# Patient Record
Sex: Male | Born: 1985 | Race: White | Hispanic: No | Marital: Single | Smoking: Never smoker
Health system: Southern US, Community
[De-identification: ages and names within clinical notes are randomized; demographics above are authoritative.]

## PROBLEM LIST (undated history)

## (undated) DIAGNOSIS — K589 Irritable bowel syndrome without diarrhea: Secondary | ICD-10-CM

## (undated) DIAGNOSIS — R011 Cardiac murmur, unspecified: Secondary | ICD-10-CM

## (undated) HISTORY — DX: Cardiac murmur, unspecified: R01.1

## (undated) HISTORY — DX: Irritable bowel syndrome without diarrhea: K58.9

---
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
## 2013-11-09 ENCOUNTER — Encounter: Payer: Self-pay | Admitting: Internal Medicine

## 2013-11-12 ENCOUNTER — Encounter: Payer: Self-pay | Admitting: Emergency Medicine

## 2014-01-01 ENCOUNTER — Other Ambulatory Visit: Payer: Self-pay | Admitting: Emergency Medicine

## 2014-01-01 MED ORDER — GLYCOPYRROLATE 2 MG PO TABS: 2.0000 mg | ORAL_TABLET | Freq: Two times a day (BID) | ORAL | Status: DC

## 2014-02-05 ENCOUNTER — Ambulatory Visit (INDEPENDENT_AMBULATORY_CARE_PROVIDER_SITE_OTHER): Payer: 59 | Admitting: Emergency Medicine

## 2014-02-05 ENCOUNTER — Encounter: Payer: Self-pay | Admitting: Emergency Medicine

## 2014-02-05 ENCOUNTER — Ambulatory Visit: Payer: Self-pay | Admitting: Physician Assistant

## 2014-02-05 VITALS — BP 104/76 | HR 62 | Temp 98.4°F | Resp 20 | Ht 68.0 in | Wt 150.0 lb

## 2014-02-05 DIAGNOSIS — M79641 Pain in right hand: Secondary | ICD-10-CM

## 2014-02-05 DIAGNOSIS — R58 Hemorrhage, not elsewhere classified: Secondary | ICD-10-CM

## 2014-02-05 DIAGNOSIS — M79609 Pain in unspecified limb: Secondary | ICD-10-CM

## 2014-02-05 DIAGNOSIS — M25531 Pain in right wrist: Secondary | ICD-10-CM

## 2014-02-05 DIAGNOSIS — M25539 Pain in unspecified wrist: Secondary | ICD-10-CM

## 2014-02-05 DIAGNOSIS — I998 Other disorder of circulatory system: Secondary | ICD-10-CM

## 2014-02-05 MED ORDER — MELOXICAM 15 MG PO TABS: 15.0000 mg | ORAL_TABLET | Freq: Every day | ORAL | Status: DC

## 2014-02-05 NOTE — Progress Notes (Signed)
   Subjective:    Patient ID: Alexander IbaJoshua M Boy, male    DOB: 01/10/1986, 28 y.o.   MRN: [id number]  HPI Comments: 28 yo male with right wrist pain he does not recall a sudden onset of pain after fall on WED or training on Thursday. He notes mild ache with certain positions. He denies any pain with push up. He has tried ice with out change. He tried to sleep in brace but made symptoms worse. He notes icy/ hot helps a little.   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 He notes bruise occurred after being tackled he denies any lumps or pain in area currently and notes it has improved with tenderness and color.  Wrist Pain    Current Outpatient Prescriptions on File Prior to Visit  Medication Sig Dispense Refill  . Cholecalciferol (VITAMIN D-3) 1000 UNITS CAPS Take by mouth.      Marland Kitchen. glycopyrrolate (ROBINUL) 2 MG tablet Take 1 tablet (2 mg total) by mouth 2 (two) times daily.  60 tablet  1   No current facility-administered medications on file prior to visit.    No Known Allergies Past Medical History  Diagnosis Date  . IBS (irritable bowel syndrome)   . Heart murmur     w/ tachy as child      Review of Systems  Musculoskeletal: Positive for arthralgias.  Hematological: Bruises/bleeds easily.  All other systems reviewed and are negative.   BP 104/76  Pulse 62  Temp(Src) 98.4 F (36.9 C) (Temporal)  Resp 20  Ht 5\' 8"  (1.727 m)  Wt 150 lb (68.04 kg)  BMI 22.81 kg/m2     Objective:   Physical Exam  Nursing note and vitals reviewed. Constitutional: He is oriented to person, place, and time. He appears well-developed and well-nourished.  HENT:  Head: Normocephalic and atraumatic.  Right Ear: External ear normal.  Left Ear: External ear normal.  Nose: Nose normal.  Mouth/Throat: Oropharynx is clear and moist.  Eyes: Conjunctivae are normal.  Neck: Normal range of motion.  Musculoskeletal: Normal range of motion. He exhibits no edema  and no tenderness.  No obvious abnormalities with Full ROM  Neurological: He is alert and oriented to person, place, and time. He has normal reflexes. No cranial nerve deficit. Coordination normal.  Skin: Skin is warm and dry.     Psychiatric: He has a normal mood and affect. Judgment normal.          Assessment & Plan:  1. Wrist/ hand discomfort- XRAY if no relief  With Meloxicam 15 mg AD, continue Icey/ HOT, d/c brace, continue Ice. 2. Bruising- Declines labs, advised if continues needs further workup

## 2014-02-05 NOTE — Patient Instructions (Signed)
Hematoma A hematoma is a collection of blood. The collection of blood can turn into a hard, painful lump under the skin. Your skin may turn blue or yellow if the hematoma is close to the surface of the skin. Most hematomas get better in a few days to weeks. Some hematomas are serious and need medical care. Hematomas can be very small or very big. HOME CARE  Apply ice to the injured area:  Put ice in a plastic bag.  Place a towel between your skin and the bag.  Leave the ice on for 20 minutes, 2 3 times a day for the first 1 to 2 days.  After the first 2 days, switch to using warm packs on the injured area.  Raise (elevate) the injured area to lessen pain and puffiness (swelling). You may also wrap the area with an elastic bandage. Make sure the bandage is not wrapped too tight.  If you have a painful hematoma on your leg or foot, you may use crutches for a couple days.  Only take medicines as told by your doctor. GET HELP RIGHT AWAY IF:   Your pain gets worse.  Your pain is not controlled with medicine.  You have a fever.  Your puffiness gets worse.  Your skin turns more blue or yellow.  Your skin over the hematoma breaks or starts bleeding.  Your hematoma is in your chest or belly (abdomen) and you are short of breath, feel weak, or have a change in consciousness.  Your hematoma is on your scalp and you have a headache that gets worse or a change in alertness or consciousness. MAKE SURE YOU:   Understand these instructions.  Will watch your condition.  Will get help right away if you are not doing well or get worse. Document Released: 12/23/2004 Document Revised: 07/18/2013 Document Reviewed: 04/25/2013 [facility]ExitCare Patient Information 2014 [place], [place]. Wrist Pain A wrist sprain happens when the bands of tissue that hold the wrist joints together (ligament) stretch too much or tear. A wrist strain happens when muscles or bands of tissue that connect muscles to bones  (tendons) are stretched or pulled. HOME CARE  Put ice on the injured area.  Put ice in a plastic bag.  Place a towel between your skin and the bag.  Leave the ice on for 15-20 minutes, 03-04 times a day, for the first 2 days.  Raise (elevate) the injured wrist to lessen puffiness (swelling).  Rest the injured wrist for at least 48 hours or as told by your doctor.  Wear a splint, cast, or an elastic wrap as told by your doctor.  Only take medicine as told by your doctor.  Follow up with your doctor as told. This is important. GET HELP RIGHT AWAY IF:   The fingers are puffy, very red, white, or cold and blue.  The fingers lose feeling (numb) or tingle.  The pain gets worse.  It is hard to move the fingers. MAKE SURE YOU:   Understand these instructions.  Will watch your condition.  Will get help right away if you are not doing well or get worse. Document Released: 05/03/2008 Document Revised: 02/07/2012 Document Reviewed: 01/06/2011 [facility]Exit[facility] Patient Information 2014 [place], [place].

## 2014-02-06 ENCOUNTER — Ambulatory Visit
Admission: RE | Admit: 2014-02-06 | Discharge: 2014-02-06 | Disposition: A | Discharge status: Home / Self Care | Payer: 59 | Source: Ambulatory Visit | Attending: Emergency Medicine | Admitting: Emergency Medicine

## 2014-02-06 DIAGNOSIS — M25531 Pain in right wrist: Secondary | ICD-10-CM

## 2014-02-06 DIAGNOSIS — M79641 Pain in right hand: Secondary | ICD-10-CM

## 2014-05-23 ENCOUNTER — Other Ambulatory Visit: Payer: Self-pay | Admitting: *Deleted

## 2014-05-23 MED ORDER — GLYCOPYRROLATE 2 MG PO TABS: 2.0000 mg | ORAL_TABLET | Freq: Two times a day (BID) | ORAL | Status: DC

## 2014-06-03 ENCOUNTER — Other Ambulatory Visit: Payer: Self-pay | Admitting: Internal Medicine

## 2014-06-03 MED ORDER — GLYCOPYRROLATE 2 MG PO TABS: 2.0000 mg | ORAL_TABLET | Freq: Two times a day (BID) | ORAL | Status: DC

## 2014-07-23 IMAGING — CR DG WRIST COMPLETE 3+V*R*
4 series · 4 of 4 positions shown · non-contrast
Comparison: none

[view not recorded (1 of 4)]
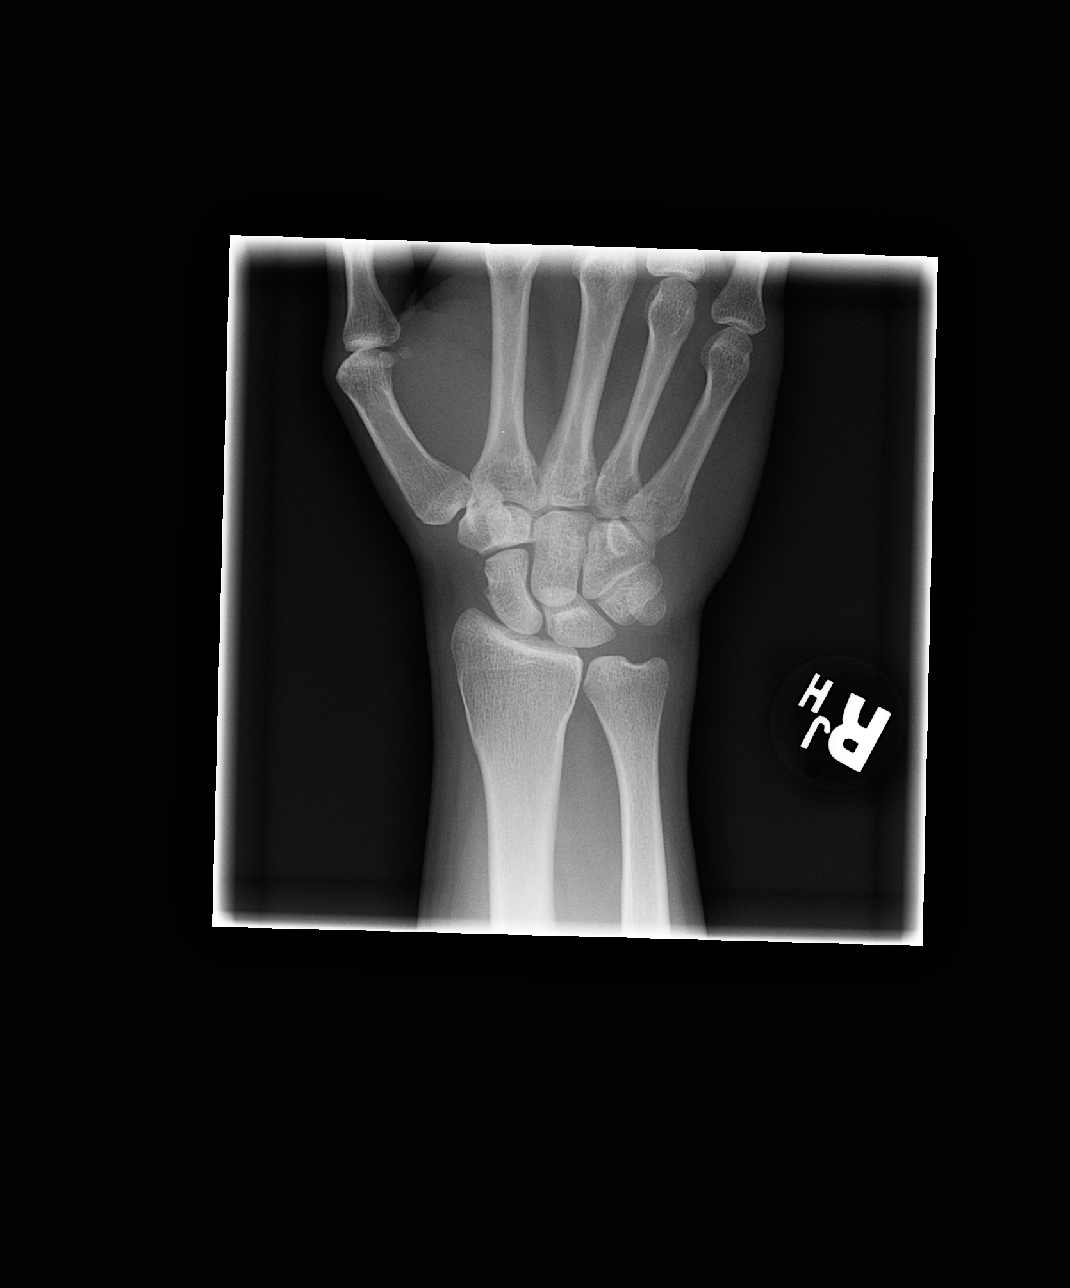

[view not recorded (2 of 4)]
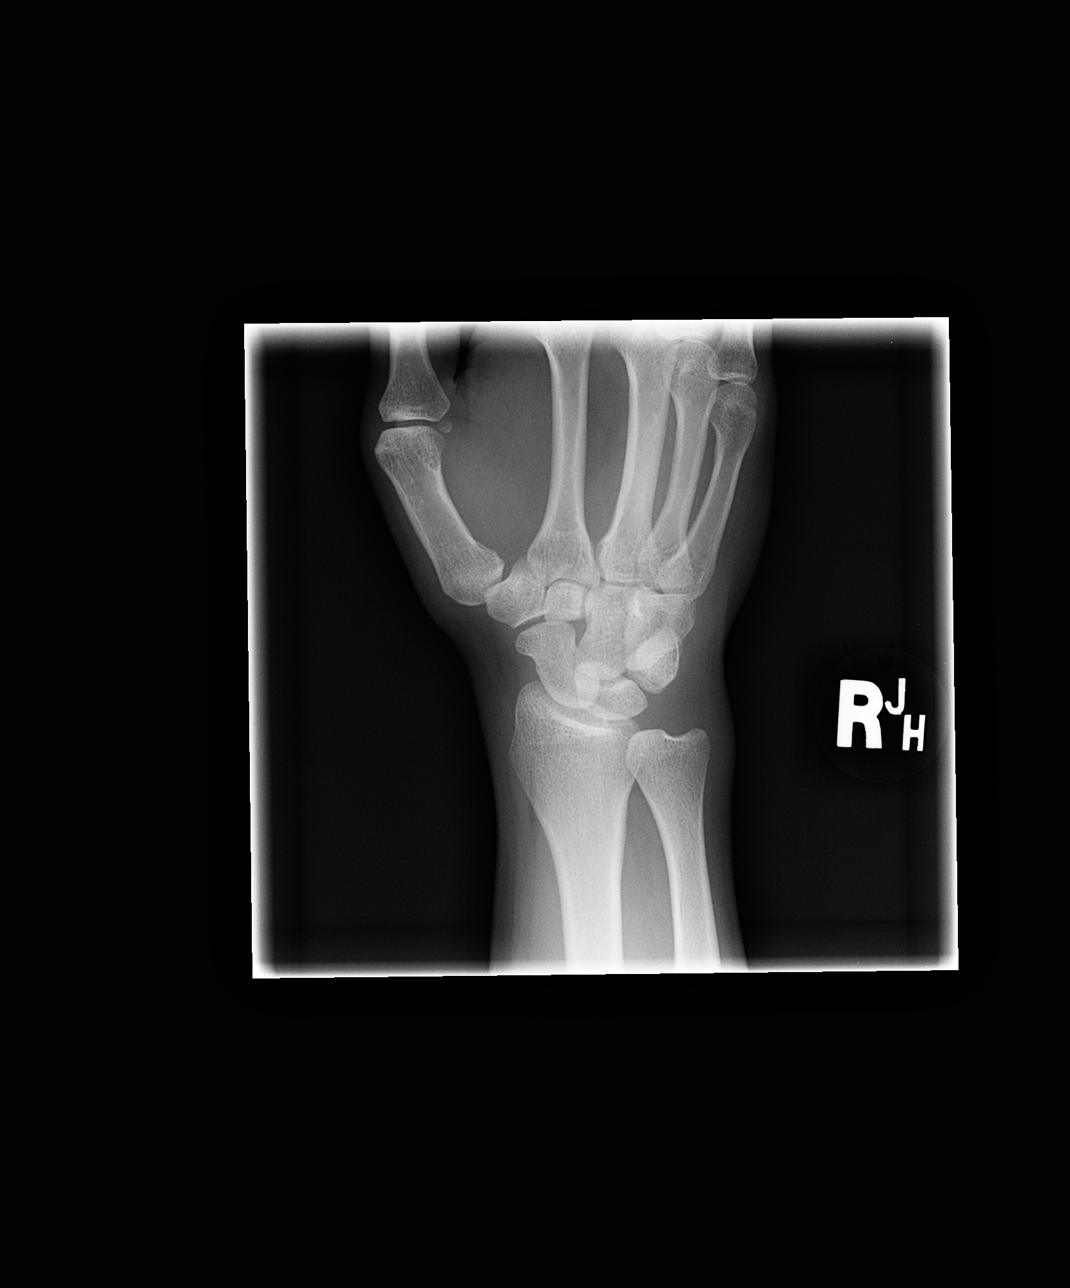

[view not recorded (3 of 4)]
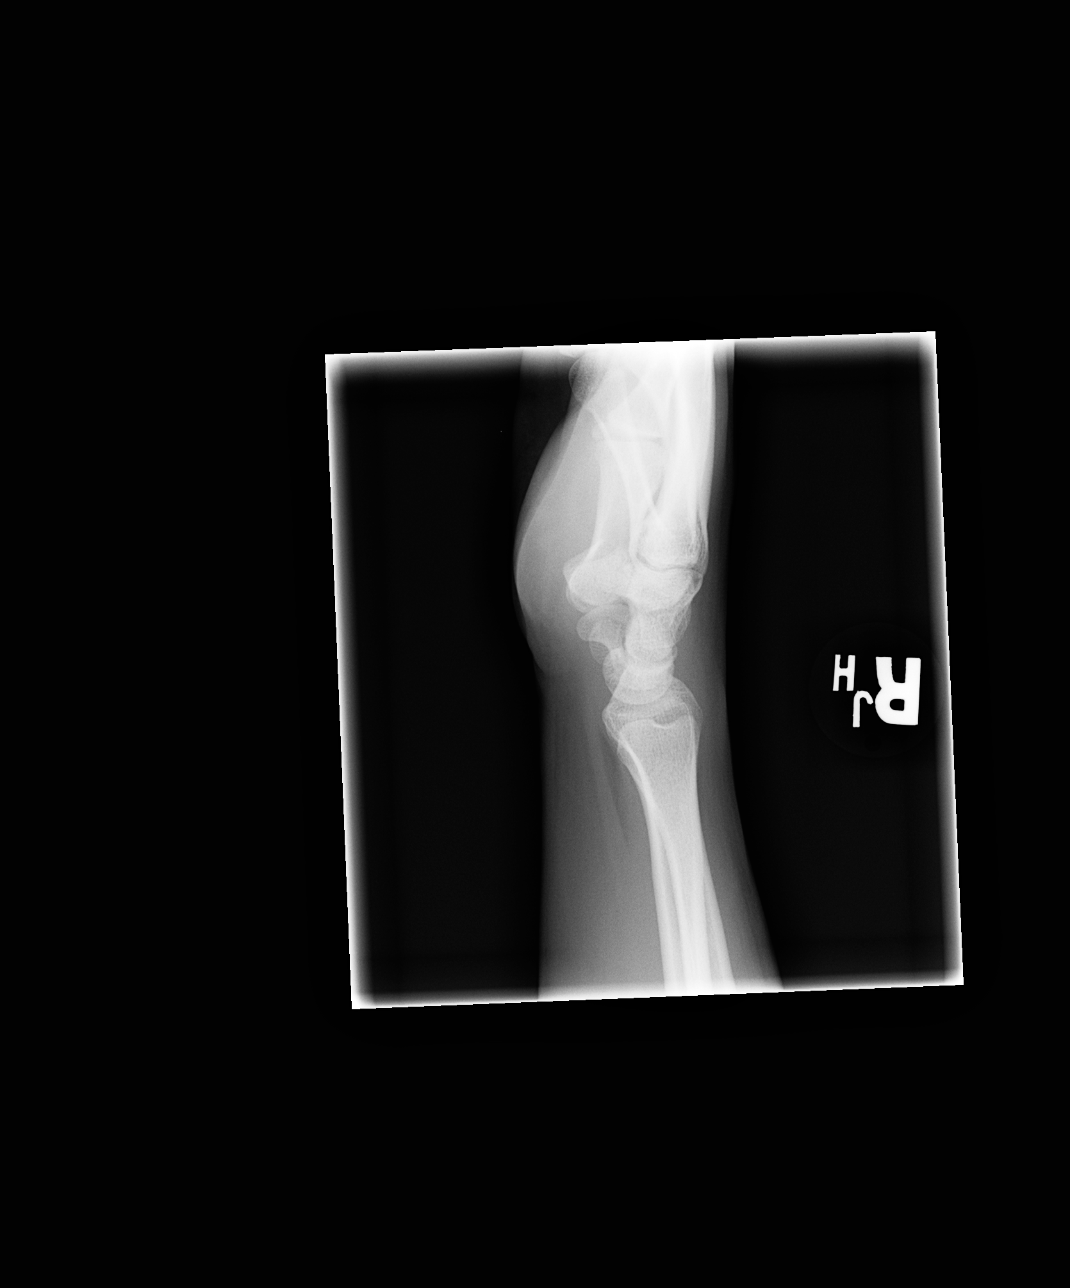

[view not recorded (4 of 4)]
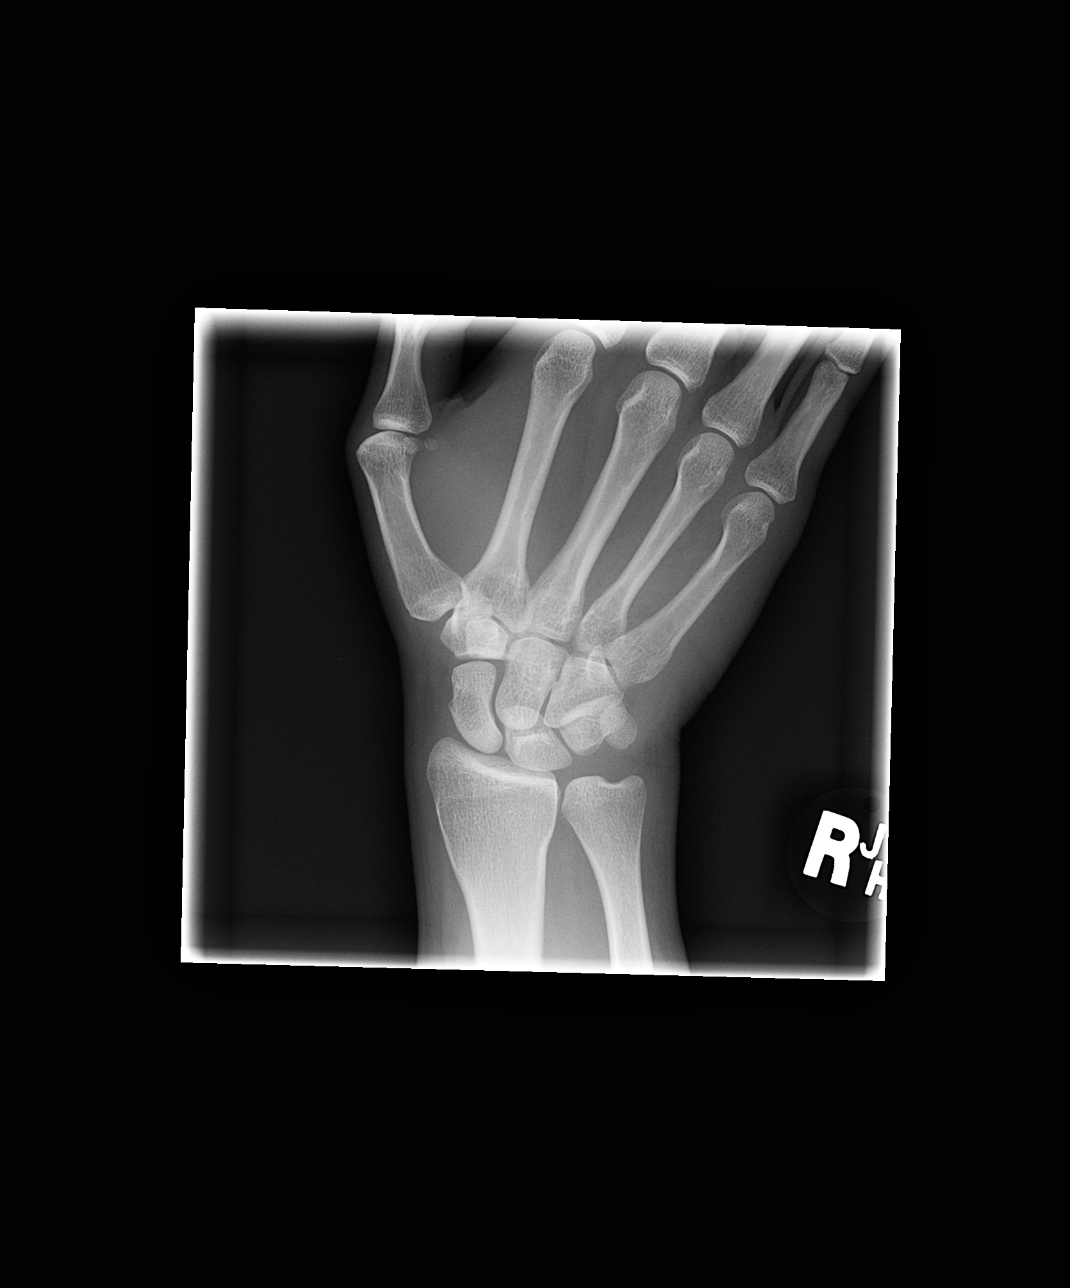

[4 of 4 positions shown; findings below may reference images not displayed]

CLINICAL DATA
Training injury, pain

EXAM
RIGHT WRIST - COMPLETE 3+ VIEW

COMPARISON
None.

FINDINGS
The radiocarpal joint space appears normal. The carpal bones are in
normal position. Intercarpal spaces appear normal. Alignment is
normal. No acute abnormality is seen.

IMPRESSION
Negative.

SIGNATURE

## 2014-08-02 ENCOUNTER — Other Ambulatory Visit: Payer: Self-pay | Admitting: *Deleted

## 2014-08-02 MED ORDER — GLYCOPYRROLATE 2 MG PO TABS: 2.0000 mg | ORAL_TABLET | Freq: Two times a day (BID) | ORAL | Status: DC

## 2014-08-06 ENCOUNTER — Telehealth: Payer: Self-pay | Admitting: Internal Medicine

## 2014-08-06 NOTE — Telephone Encounter (Signed)
NO REFILLS UNTIL OV PER AC  Thank you, [facility] [phone] Fax [phone]

## 2014-08-06 NOTE — Telephone Encounter (Signed)
PHARM SAID ANOTHER DR CALLED GLYCOPYRROLA INTO PHARM TODAY  Thank you, [facility] [phone] Fax [phone]

## 2014-10-14 ENCOUNTER — Other Ambulatory Visit: Payer: Self-pay | Admitting: Internal Medicine

## 2014-11-05 ENCOUNTER — Encounter: Payer: Self-pay | Admitting: Physician Assistant

## 2014-11-05 ENCOUNTER — Ambulatory Visit (INDEPENDENT_AMBULATORY_CARE_PROVIDER_SITE_OTHER): Payer: 59 | Admitting: Physician Assistant

## 2014-11-05 VITALS — BP 110/72 | HR 60 | Temp 98.2°F | Resp 16 | Ht 68.0 in | Wt 153.0 lb

## 2014-11-05 DIAGNOSIS — K589 Irritable bowel syndrome without diarrhea: Secondary | ICD-10-CM

## 2014-11-05 DIAGNOSIS — K2100 Gastro-esophageal reflux disease with esophagitis, without bleeding: Secondary | ICD-10-CM

## 2014-11-05 DIAGNOSIS — K219 Gastro-esophageal reflux disease without esophagitis: Secondary | ICD-10-CM | POA: Insufficient documentation

## 2014-11-05 MED ORDER — RANITIDINE HCL 300 MG PO TABS: 300.0000 mg | ORAL_TABLET | Freq: Every day | ORAL | Status: DC

## 2014-11-05 MED ORDER — DICYCLOMINE HCL 10 MG PO CAPS: 10.0000 mg | ORAL_CAPSULE | Freq: Three times a day (TID) | ORAL | Status: DC

## 2014-11-05 NOTE — Patient Instructions (Signed)
Benefiber is good for constipation/diarrhea/irritable bowel syndrome, it helps with weight loss and can help lower your bad cholesterol. Please do 1-2 TBSP in the morning in water, coffee, or tea. It can take up to a month before you can see a difference with your bowel movements. It is cheapest from [employer], [employer], [employer].   Irritable Bowel Syndrome Irritable bowel syndrome (IBS) is caused by a disturbance of normal bowel function and is a common digestive disorder. You may also hear this condition called spastic colon, mucous colitis, and irritable colon. There is no cure for IBS. However, symptoms often gradually improve or disappear with a good diet, stress management, and medicine. This condition usually appears in late adolescence or early adulthood. Women develop it twice as often as men. CAUSES  After food has been digested and absorbed in the small intestine, waste material is moved into the large intestine, or colon. In the colon, water and salts are absorbed from the undigested products coming from the small intestine. The remaining residue, or fecal material, is held for elimination. Under normal circumstances, gentle, rhythmic contractions of the bowel walls push the fecal material along the colon toward the rectum. In IBS, however, these contractions are irregular and poorly coordinated. The fecal material is either retained too long, resulting in constipation, or expelled too soon, producing diarrhea. SIGNS AND SYMPTOMS  The most common symptom of IBS is abdominal pain. It is often in the lower left side of the abdomen, but it may occur anywhere in the abdomen. The pain comes from spasms of the bowel muscles happening too much and from the buildup of gas and fecal material in the colon. This pain:  Can range from sharp abdominal cramps to a dull, continuous ache.  Often worsens soon after eating.  Is often relieved by having a bowel movement or passing gas. Abdominal pain is usually  accompanied by constipation, but it may also produce diarrhea. The diarrhea often occurs right after a meal or upon waking up in the morning. The stools are often soft, watery, and flecked with mucus. Other symptoms of IBS include:  Bloating.  Loss of appetite.  Heartburn.  Backache.  Dull pain in the arms or shoulders.  Nausea.  Burping.  Vomiting.  Gas. IBS may also cause symptoms that are unrelated to the digestive system, such as:  Fatigue.  Headaches.  Anxiety.  Shortness of breath.  Trouble concentrating.  Dizziness. These symptoms tend to come and go. DIAGNOSIS  The symptoms of IBS may seem like symptoms of other, more serious digestive disorders. Your health care provider may want to perform tests to exclude these disorders.  TREATMENT Many medicines are available to help correct bowel function or relieve bowel spasms and abdominal pain. Among the medicines available are:  Laxatives for severe constipation and to help restore normal bowel habits.  Specific antidiarrheal medicines to treat severe or lasting diarrhea.  Antispasmodic agents to relieve intestinal cramps. Your health care provider may also decide to treat you with a mild tranquilizer or sedative during unusually stressful periods in your life. Your health care provider may also prescribe antidepressant medicine. The use of this medicine has been shown to reduce pain and other symptoms of IBS. Remember that if any medicine is prescribed for you, you should take it exactly as directed. Make sure your health care provider knows how well it worked for you. HOME CARE INSTRUCTIONS   Take all medicines as directed by your health care provider.  Avoid foods that  are high in fat or oils, such as heavy cream, butter, frankfurters, sausage, and other fatty meats.  Avoid foods that make you go to the bathroom, such as fruit, fruit juice, and dairy products.  Cut out carbonated drinks, chewing gum, and  "gassy" foods such as beans and cabbage. This may help relieve bloating and burping.  Eat foods with bran, and drink plenty of liquids with the bran foods. This helps relieve constipation.  Keep track of what foods seem to bring on your symptoms.  Avoid emotionally charged situations or circumstances that produce anxiety.  Start or continue exercising.  Get plenty of rest and sleep. Document Released: 11/15/2005 Document Revised: 11/20/2013 Document Reviewed: 07/05/2008 [facility] LLCExitCare Patient Information 2015 [place], [place]LL[place]. This information is not intended to replace advice given to you by your health care provider. Make sure you discuss any questions you have with your health care provider.  .Marland Kitchen

## 2014-11-05 NOTE — Progress Notes (Signed)
Assessment and Plan:  GERD- zantac sent in Insomnia- good sleep hygiene discussed, increase day time activity, try melatonin or benadryl if this does not help we will call in sleep medication.  IBS- diet discussed, try dicyclomine, if this does not help will fill Rubinol.   Continue diet and meds as discussed. Further disposition pending results of labs. 6 month follow up for CPE  HPI 28 y.o. male  presents for 3 month follow up with hypertension, hyperlipidemia, prediabetes and vitamin D. His blood pressure has been controlled at home, today their BP is BP: 110/72 mmHg He does workout. He denies chest pain, shortness of breath, dizziness.  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   Current Medications:  Current Outpatient Prescriptions on File Prior to Visit  Medication Sig Dispense Refill  . glycopyrrolate (ROBINUL) 2 MG tablet Take 1 tablet (2 mg total) by mouth 2 (two) times daily. 60 tablet 0   No current facility-administered medications on file prior to visit.   Medical History:  Past Medical History  Diagnosis Date  . IBS (irritable bowel syndrome)   . Heart murmur     w/ tachy as child   Allergies: No Known Allergies   Review of Systems:  Review of Systems  Constitutional: Negative.   HENT: Negative.   Eyes: Negative.   Respiratory: Negative.   Cardiovascular: Negative.   Gastrointestinal: Positive for abdominal pain and diarrhea. Negative for heartburn, nausea, vomiting, constipation, blood in stool and melena.  Genitourinary: Negative.   Musculoskeletal: Negative.   Skin: Negative.   Neurological: Negative.   Endo/Heme/Allergies: Negative.   Psychiatric/Behavioral: Negative.      Family history- Review and unchanged Social history- Review and unchanged Physical Exam: BP 110/72 mmHg  Pulse 60  Temp(Src)  98.2 F (36.8 C)  Resp 16  Ht 5\' 8"  (1.727 m)  Wt 153 lb (69.4 kg)  BMI 23.27 kg/m2 Wt Readings from Last 3 Encounters:  11/05/14 153 lb (69.4 kg)  02/05/14 150 lb (68.04 kg)   General Appearance: Well nourished, in no apparent distress. Eyes: PERRLA, EOMs, conjunctiva no swelling or erythema Sinuses: No Frontal/maxillary tenderness ENT/Mouth: Ext aud canals clear, TMs without erythema, bulging. No erythema, swelling, or exudate on post pharynx.  Tonsils not swollen or erythematous. Hearing normal.  Neck: Supple, thyroid normal.  Respiratory: Respiratory effort normal, BS equal bilaterally without rales, rhonchi, wheezing or stridor.  Cardio: RRR with no MRGs. Brisk peripheral pulses without edema.  Abdomen: Soft, + BS.  Non tender, no guarding, rebound, hernias, masses. Lymphatics: Non tender without lymphadenopathy.  Musculoskeletal: Full ROM, 5/5 strength, normal gait.  Skin: Warm, dry without rashes, lesions, ecchymosis.  Neuro: Cranial nerves intact. Normal muscle tone, no cerebellar symptoms. Sensation intact.  Psych: Awake and oriented X 3, normal affect, Insight and Judgment appropriate.    Quentin Mullingollier, Aissa Lisowski, PA-C 9:32 AM [facility]

## 2014-11-12 ENCOUNTER — Other Ambulatory Visit: Payer: Self-pay | Admitting: Physician Assistant

## 2014-11-12 MED ORDER — RANITIDINE HCL 300 MG PO TABS: 300.0000 mg | ORAL_TABLET | Freq: Every day | ORAL | Status: DC

## 2014-11-15 ENCOUNTER — Other Ambulatory Visit: Payer: Self-pay

## 2014-11-15 MED ORDER — GLYCOPYRROLATE 2 MG PO TABS: 2.0000 mg | ORAL_TABLET | Freq: Two times a day (BID) | ORAL | Status: DC

## 2015-01-16 ENCOUNTER — Telehealth (INDEPENDENT_AMBULATORY_CARE_PROVIDER_SITE_OTHER): Payer: 59 | Admitting: Physician Assistant

## 2015-01-16 DIAGNOSIS — J069 Acute upper respiratory infection, unspecified: Secondary | ICD-10-CM

## 2015-01-16 MED ORDER — AZITHROMYCIN 250 MG PO TABS: ORAL_TABLET | ORAL | Status: DC

## 2015-01-16 MED ORDER — PREDNISONE 20 MG PO TABS: ORAL_TABLET | ORAL | Status: DC

## 2015-01-16 NOTE — Telephone Encounter (Signed)
EVISIT TELEPHONE Patient called the office for a telephone visit complaining of symptoms of a URI. Symptoms include bilateral ear pressure/pain, productive cough with  yellow colored sputum, purulent nasal discharge, sinus pressure, sore throat and headache. Onset of symptoms was 4 days ago, and has been gradually worsening since that time. Treatment to date: mucinex, nyquil.  PLAN: URI- Discussed diagnosis and treatment of URI. Discussed the importance of avoiding unnecessary antibiotic therapy. Suggested symptomatic OTC remedies. Nasal saline spray for congestion. Zithromax per orders. Follow up as needed. Follow up in 10 days or as needed.

## 2015-05-14 ENCOUNTER — Encounter: Payer: Self-pay | Admitting: Physician Assistant

## 2015-05-15 ENCOUNTER — Ambulatory Visit (INDEPENDENT_AMBULATORY_CARE_PROVIDER_SITE_OTHER): Payer: 59 | Admitting: Internal Medicine

## 2015-05-15 ENCOUNTER — Encounter: Payer: Self-pay | Admitting: Internal Medicine

## 2015-05-15 VITALS — BP 114/82 | HR 86 | Temp 98.4°F | Resp 18 | Ht 68.0 in | Wt 152.0 lb

## 2015-05-15 DIAGNOSIS — K219 Gastro-esophageal reflux disease without esophagitis: Secondary | ICD-10-CM

## 2015-05-15 DIAGNOSIS — R079 Chest pain, unspecified: Secondary | ICD-10-CM

## 2015-05-15 MED ORDER — SUCRALFATE 1 GM/10ML PO SUSP: 1.0000 g | Freq: Three times a day (TID) | ORAL | Status: DC

## 2015-05-15 MED ORDER — OMEPRAZOLE 40 MG PO CPDR: 40.0000 mg | DELAYED_RELEASE_CAPSULE | Freq: Every day | ORAL | Status: DC

## 2015-05-15 NOTE — Patient Instructions (Signed)

## 2015-05-15 NOTE — Progress Notes (Signed)
   Subjective:    Patient ID: Alexander White, male    DOB: Jan 20, 1986, 29 y.o.   MRN: [id number]  Gastrophageal Reflux He complains of abdominal pain and chest pain. He reports no coughing, no nausea or no wheezing. Pertinent negatives include no fatigue.   Patient reports that Tuesday evening he reports that he has been having a burning sensation in his throat which has been going on nearly constantly since the onset.  He reports a lot of burping as well.  He reports that pepto bismol, zantac, zegrid OTC.  He denies epigastric pain and water brash.  He does report that it has woken him from sleeping.  He reports that he has had reflux and indigestion in the past but this is significantly worse.  He reports that it doesn't seem to be worse with eating.  Peppermint tea did aggravate it.  He reports that he takes a lot of benadryl but does not have ibuprofen frequently.  He reports that he does not drink a lot of alcohol either.     Review of Systems  Constitutional: Negative for fever, chills and fatigue.  HENT: Negative for trouble swallowing and voice change.   Respiratory: Negative for cough, chest tightness, shortness of breath and wheezing.   Cardiovascular: Positive for chest pain. Negative for palpitations and leg swelling.  Gastrointestinal: Positive for abdominal pain. Negative for nausea, vomiting, diarrhea, constipation, blood in stool, abdominal distention and anal bleeding.       Objective:   Physical Exam  Constitutional: He is oriented to person, place, and time. He appears well-developed and well-nourished. No distress.  HENT:  Head: Normocephalic and atraumatic.  Mouth/Throat: Oropharynx is clear and moist. No oropharyngeal exudate.  Eyes: Conjunctivae and EOM are normal. Pupils are equal, round, and reactive to light. No scleral icterus.  Neck: Normal range of motion. Neck supple. No JVD present. No thyromegaly present.  Cardiovascular: Normal rate, regular rhythm, normal  heart sounds and intact distal pulses.  Exam reveals no gallop and no friction rub.   No murmur heard. Pulmonary/Chest: Effort normal and breath sounds normal. No respiratory distress. He has no wheezes. He has no rales. He exhibits no tenderness.  Abdominal: Soft. Bowel sounds are normal. He exhibits no distension and no mass. There is no tenderness. There is no rebound and no guarding.  Musculoskeletal: Normal range of motion.  Lymphadenopathy:    He has no cervical adenopathy.  Neurological: He is alert and oriented to person, place, and time. He has normal strength. No cranial nerve deficit or sensory deficit. Coordination normal.  Skin: Skin is warm and dry. He is not diaphoretic.  Psychiatric: He has a normal mood and affect. His behavior is normal. Judgment and thought content normal.  Nursing note and vitals reviewed.         Assessment & Plan:    1. Gastroesophageal reflux disease, esophagitis presence not specified  -ekg WNL -carafate -omeprazole -follow-up prn -if no improvement consider testing for H.Pylori

## 2015-05-23 NOTE — Addendum Note (Signed)
Addended by: Adam Demary A on: 05/23/2015 02:41 PM   Modules accepted: Orders

## 2015-06-23 ENCOUNTER — Encounter: Payer: Self-pay | Admitting: Physician Assistant

## 2015-07-09 ENCOUNTER — Encounter: Payer: Self-pay | Admitting: Physician Assistant

## 2015-07-15 ENCOUNTER — Encounter: Payer: Self-pay | Admitting: Physician Assistant

## 2015-08-11 ENCOUNTER — Ambulatory Visit (INDEPENDENT_AMBULATORY_CARE_PROVIDER_SITE_OTHER): Payer: 59 | Admitting: Physician Assistant

## 2015-08-11 VITALS — BP 100/74 | HR 76 | Temp 97.9°F | Resp 16 | Ht 68.0 in | Wt 156.0 lb

## 2015-08-11 DIAGNOSIS — J069 Acute upper respiratory infection, unspecified: Secondary | ICD-10-CM

## 2015-08-11 MED ORDER — BENZONATATE 100 MG PO CAPS: 200.0000 mg | ORAL_CAPSULE | Freq: Three times a day (TID) | ORAL | Status: DC | PRN

## 2015-08-11 MED ORDER — AZITHROMYCIN 250 MG PO TABS: ORAL_TABLET | ORAL | Status: AC

## 2015-08-11 MED ORDER — PREDNISONE 20 MG PO TABS: ORAL_TABLET | ORAL | Status: DC

## 2015-08-11 NOTE — Progress Notes (Signed)
   Subjective:    Patient ID: Alexander White, male    DOB: [birth year], 29 y.o.   MRN: [id number]  HPI 29 y.o. WM with history of sinus issues since yesterday. He has sore throat, sinus congestion, no fever or chills. He has been on tylenol cold and flu, vitamin C. He is leaving for a conference tomorrow in [place].   Blood pressure 100/74, pulse 76, temperature 97.9 F (36.6 C), resp. rate 16, height  (1.727 m), weight 156 lb (70.761 kg).  Review of Systems  Constitutional: Negative for fever, chills and diaphoresis.  HENT: Positive for congestion, postnasal drip, rhinorrhea, sinus pressure and sore throat. Negative for ear pain, sneezing, trouble swallowing and voice change.   Eyes: Negative.   Respiratory: Positive for cough. Negative for chest tightness, shortness of breath and wheezing.   Cardiovascular: Negative.   Gastrointestinal: Negative.   Genitourinary: Negative.   Musculoskeletal: Negative.  Negative for neck pain.  Neurological: Negative.  Negative for headaches.       Objective:   Physical Exam  Constitutional: He is oriented to person, place, and time. He appears well-developed and well-nourished.  HENT:  Head: Normocephalic and atraumatic.  Right Ear: External ear normal.  Left Ear: External ear normal.  Mouth/Throat: Oropharynx is clear and moist.  Eyes: Conjunctivae and EOM are normal. Pupils are equal, round, and reactive to light.  Neck: Normal range of motion. Neck supple.  Cardiovascular: Normal rate, regular rhythm and normal heart sounds.   Pulmonary/Chest: Effort normal and breath sounds normal.  Abdominal: Soft. Bowel sounds are normal.  Musculoskeletal: Normal range of motion.  Neurological: He is alert and oriented to person, place, and time. No cranial nerve deficit.  Skin: Skin is warm and dry.  Psychiatric: He has a normal mood and affect. His behavior is normal.      Assessment & Plan:  Allergies/virus- Will hold the zpak and take if she  is not getting better, increase fluids, rest, cont allergy pill

## 2015-08-11 NOTE — Patient Instructions (Signed)
Sinusitis can be uncomfortable. People with sinusitis have congestion with yellow/green/gray discharge, sinus pain/pressure, pain around the eyes. Sinus infections almost ALWAYS stem from a viral infection and antibiotics don't work against a virus. Even when bacteria is responsible, the infections usually clear up on their own in a week or so.   PLEASE TRY TO DO OVER THE COUNTER TREATMENT AND PREDNISONE FOR 5-7 DAYS AND IF YOU ARE NOT GETTING BETTER OR GETTING WORSE THEN YOU CAN START ON AN ANTIBIOTIC GIVEN.  Can take the prednisone AT NIGHT WITH DINNER, it take 8-12 hours to start working so it will NOT affect your sleeping if you take it at night with your food!! Take two pills the first night and 1 or two pill the second night and then 1 pill the other nights.   Risk of antibiotic use: About 1 in 4 people who take antibiotics have side effects including stomach problems, dizziness, or rashes. Those problems clear up soon after stopping the drugs, but in rare cases antibiotics can cause severe allergic reaction. Over use of antibiotics also encourages the growth of bacteria that can't be controlled easily with drugs. That makes you more vunerable to antibiotic-resistant infections and undermines the benefits of antibiotics for others.   Waste of Money: Antibiotics often aren't very expensive, but any money spent on unnecessary drugs is money down the drain.   When are antibiotics needed? Only when symptoms last longer than a week.  Start to improve but then worsen again  -It can take up to 2 weeks to feel better.   -If you do not get better in 7-10 days (Have fever, facial pain, dental pain and swelling), then please call the office and it is now appropriate to start an antibiotic.   -Please take Tylenol or Ibuprofen for pain. -Acetaminiphen 325mg orally every 4-6 hours for pain.  Max: 10 per day -Ibuprofen 200mg orally every 6-8 hours for pain.  Take with food to avoid ulcers.   Max 10 per  day  Please pick one of the over the counter allergy medications below and take it once daily for allergies.  Claritin or loratadine cheapest but likely the weakest  Zyrtec or certizine at night because it can make you sleepy The strongest is allegra or fexafinadine  Cheapest at [employer], [employer], [employer]  -While drinking fluids, pinch and hold nose close and swallow.  This will help open up your eustachian tubes to drain the fluid behind your ear drums. -Try steam showers to open your nasal passages.   Drink lots of water to stay hydrated and to thin mucous.  Flonase/Nasonex is to help the inflammation.  Take 2 sprays in each nostril at bedtime.  Make sure you spray towards the outside of each nostril towards the outer corner of your eye, hold nose close and tilt head back.  This will help the medication get into your sinuses.  If you do not like this medication, then use saline nasal sprays same directions as above for Flonase. Stop the medication right away if you get blurring of your vision or nose bleeds.  Sinusitis Sinusitis is redness, soreness, and inflammation of the paranasal sinuses. Paranasal sinuses are air pockets within the bones of your face (beneath the eyes, the middle of the forehead, or above the eyes). In healthy paranasal sinuses, mucus is able to drain out, and air is able to circulate through them by way of your nose. However, when your paranasal sinuses are inflamed, mucus and air can   become trapped. This can allow bacteria and other germs to grow and cause infection. Sinusitis can develop quickly and last only a short time (acute) or continue over a long period (chronic). Sinusitis that lasts for more than 12 weeks is considered chronic.  CAUSES  Causes of sinusitis include: Allergies. Structural abnormalities, such as displacement of the cartilage that separates your nostrils (deviated septum), which can decrease the air flow through your nose and sinuses and affect sinus  drainage. Functional abnormalities, such as when the small hairs (cilia) that line your sinuses and help remove mucus do not work properly or are not present. SIGNS AND SYMPTOMS  Symptoms of acute and chronic sinusitis are the same. The primary symptoms are pain and pressure around the affected sinuses. Other symptoms include: Upper toothache. Earache. Headache. Bad breath. Decreased sense of smell and taste. A cough, which worsens when you are lying flat. Fatigue. Fever. Thick drainage from your nose, which often is green and may contain pus (purulent). Swelling and warmth over the affected sinuses. DIAGNOSIS  Your health care provider will perform a physical exam. During the exam, your health care provider may: Look in your nose for signs of abnormal growths in your nostrils (nasal polyps).  Tap over the affected sinus to check for signs of infection. View the inside of your sinuses (endoscopy) using an imaging device that has a light attached (endoscope). If your health care provider suspects that you have chronic sinusitis, one or more of the following tests may be recommended: Allergy tests. Nasal culture. A sample of mucus is taken from your nose, sent to a lab, and screened for bacteria. Nasal cytology. A sample of mucus is taken from your nose and examined by your health care provider to determine if your sinusitis is related to an allergy. TREATMENT  Most cases of acute sinusitis are related to a viral infection and will resolve on their own within 10 days. Sometimes medicines are prescribed to help relieve symptoms (pain medicine, decongestants, nasal steroid sprays, or saline sprays).  However, for sinusitis related to a bacterial infection, your health care provider will prescribe antibiotic medicines. These are medicines that will help kill the bacteria causing the infection.  Rarely, sinusitis is caused by a fungal infection. In theses cases, your health care provider will  prescribe antifungal medicine. For some cases of chronic sinusitis, surgery is needed. Generally, these are cases in which sinusitis recurs more than 3 times per year, despite other treatments. HOME CARE INSTRUCTIONS  Drink plenty of water. Water helps thin the mucus so your sinuses can drain more easily. Use a humidifier. Inhale steam 3 to 4 times a day (for example, sit in the bathroom with the shower running). Apply a warm, moist washcloth to your face 3 to 4 times a day, or as directed by your health care provider. Use saline nasal sprays to help moisten and clean your sinuses. Take medicines only as directed by your health care provider. If you were prescribed either an antibiotic or antifungal medicine, finish it all even if you start to feel better. SEEK IMMEDIATE MEDICAL CARE IF: You have increasing pain or severe headaches. You have nausea, vomiting, or drowsiness. You have swelling around your face. You have vision problems. You have a stiff neck. You have difficulty breathing. MAKE SURE YOU:  Understand these instructions. Will watch your condition. Will get help right away if you are not doing well or get worse. Document Released: 11/15/2005 Document Revised: 04/01/2014 Document Reviewed: 11/30/2011 [employer]   Patient Information 2015 [employer]. This information is not intended to replace advice given to you by your health care provider. Make sure you discuss any questions you have with your health care provider.   

## 2015-09-04 ENCOUNTER — Other Ambulatory Visit: Payer: Self-pay

## 2015-09-04 MED ORDER — GLYCOPYRROLATE 2 MG PO TABS: 2.0000 mg | ORAL_TABLET | Freq: Two times a day (BID) | ORAL | Status: DC

## 2015-09-07 ENCOUNTER — Other Ambulatory Visit: Payer: Self-pay | Admitting: Internal Medicine

## 2015-10-01 ENCOUNTER — Other Ambulatory Visit: Payer: Self-pay | Admitting: Internal Medicine

## 2015-11-08 ENCOUNTER — Other Ambulatory Visit: Payer: Self-pay | Admitting: Physician Assistant

## 2016-01-25 ENCOUNTER — Other Ambulatory Visit: Payer: Self-pay | Admitting: Internal Medicine

## 2016-02-23 ENCOUNTER — Ambulatory Visit (INDEPENDENT_AMBULATORY_CARE_PROVIDER_SITE_OTHER): Payer: 59 | Admitting: Internal Medicine

## 2016-02-23 ENCOUNTER — Encounter: Payer: Self-pay | Admitting: Internal Medicine

## 2016-02-23 VITALS — BP 136/84 | HR 72 | Temp 97.5°F | Resp 16 | Ht 68.0 in | Wt 159.0 lb

## 2016-02-23 DIAGNOSIS — J0100 Acute maxillary sinusitis, unspecified: Secondary | ICD-10-CM

## 2016-02-23 DIAGNOSIS — J0410 Acute tracheitis without obstruction: Secondary | ICD-10-CM

## 2016-02-23 MED ORDER — AZITHROMYCIN 250 MG PO TABS: ORAL_TABLET | ORAL | Status: DC

## 2016-02-23 MED ORDER — PREDNISONE 20 MG PO TABS: ORAL_TABLET | ORAL | Status: DC

## 2016-02-23 MED ORDER — PROMETHAZINE-DM 6.25-15 MG/5ML PO SYRP: ORAL_SOLUTION | ORAL | Status: DC

## 2016-02-23 NOTE — Progress Notes (Deleted)
Patient ID: Alexander White, male   DOB: 05/28/1986, 30 y.o.   MRN: [id number]

## 2016-02-23 NOTE — Progress Notes (Signed)
  Subjective:    Patient ID: Alexander White, male    DOB: 09/16/1986, 30 y.o.   MRN: [id number]  HPI  This nice 30 yo WM presented with a 4-5 day hx/o head & chest congestio with nasal drainage and sputum becoming mucopurulent and yellowish-green. Denies fever, chills, sweats, rashes or dyspnea.   Medication Sig  . CARAFATE 1 GM/10ML suspension TAKE 10 MLS (1 G TOTAL) BY MOUTH 4 (FOUR) TIMES DAILY - WITH MEALS AND AT BEDTIME.  Marland Kitchen. glycopyrrolate (ROBINUL) 2 MG tablet Take 1 tablet (2 mg total) by mouth 2 (two) times daily.  Marland Kitchen. omeprazole  40 MG capsule TAKE 1 CAPSULE (40 MG TOTAL) BY MOUTH DAILY.   No Known Allergies   Past Medical History  Diagnosis Date  . IBS (irritable bowel syndrome)   . Heart murmur     w/ tachy as child   Review of Systems  10 point systems review negative except as above.    Objective:   Physical Exam  BP 136/84 mmHg  Pulse 72  Temp(Src) 97.5 F (36.4 C)  Resp 16  Ht 5\' 8"  (1.727 m)  Wt 159 lb (72.122 kg)  BMI 24.18 kg/m2  In no acute distress. Exposed skin w/o rash or cyanosis.   HEENT - Eac's patent. TM's Nl. EOM's full. PERRLA. (+) Maxillary sinus areas.  NasoOroPharynx clear. Neck - supple. Nl Thyroid. Carotids 2+ & No bruits, nodes, JVD Chest - Scattered rales & rhonchi and no wheezes. Cor - Nl HS. RRR w/o sig MGR. PP 1(+). No edema. MS- FROM w/o deformities. Muscle power, tone and bulk Nl. Gait Nl. Neuro - No obvious Cr N abnormalities. Sensory, motor and Cerebellar functions appear Nl w/o focal abnormalities.     Assessment & Plan:   1. Tracheitis   2. Acute maxillary sinusitis, recurrence not specified  - predniSONE (DELTASONE) 20 MG tablet; 1 tab 3 x day for 3 days, then 1 tab 2 x day for 3 days, then 1 tab 1 x day for 5 days  Dispense: 20 tablet; Refill: 0 - azithromycin (ZITHROMAX) 250 MG tablet; Take 2 tablets (500 mg) on  Day 1,  followed by 1 tablet (250 mg) once daily on Days 2 through 5.  Dispense: 6 each; Refill: 1 -  promethazine-dextromethorphan (PROMETHAZINE-DM) 6.25-15 MG/5ML syrup; Take 1 to 2 tsp enery 4 hours if needed for cough  Dispense: 360 mL; Refill: 1  - discussed meds & SR's & ROV prn.

## 2016-03-01 ENCOUNTER — Other Ambulatory Visit: Payer: Self-pay | Admitting: Internal Medicine

## 2016-03-17 ENCOUNTER — Ambulatory Visit (INDEPENDENT_AMBULATORY_CARE_PROVIDER_SITE_OTHER): Payer: 59 | Admitting: Physician Assistant

## 2016-03-17 ENCOUNTER — Encounter: Payer: Self-pay | Admitting: Physician Assistant

## 2016-03-17 VITALS — BP 110/90 | HR 83 | Temp 97.6°F | Resp 16 | Ht 68.0 in | Wt 162.2 lb

## 2016-03-17 DIAGNOSIS — K219 Gastro-esophageal reflux disease without esophagitis: Secondary | ICD-10-CM

## 2016-03-17 DIAGNOSIS — G4700 Insomnia, unspecified: Secondary | ICD-10-CM

## 2016-03-17 DIAGNOSIS — F411 Generalized anxiety disorder: Secondary | ICD-10-CM | POA: Diagnosis not present

## 2016-03-17 MED ORDER — TRAZODONE HCL 50 MG PO TABS: ORAL_TABLET | ORAL | Status: DC

## 2016-03-17 MED ORDER — BUPROPION HCL ER (XL) 150 MG PO TB24: 150.0000 mg | ORAL_TABLET | ORAL | Status: DC

## 2016-03-17 MED ORDER — RANITIDINE HCL 300 MG PO TABS: 300.0000 mg | ORAL_TABLET | Freq: Every day | ORAL | Status: DC

## 2016-03-17 NOTE — Patient Instructions (Signed)
Start on wellbutrin 150XL in the AM- this will help with anxiety and concentration- take WITH food Take trazodone at night, start 1/2 pill, can take up to 2 pills at night or 100mg  Continue prilosec during the day and take Zantac at night 150-300mg  for 2 weeks.   Call if not better or any worse.   Palpitations A palpitation is the feeling that your heartbeat is irregular or is faster than normal. It may feel like your heart is fluttering or skipping a beat. Palpitations are usually not a serious problem. However, in some cases, you may need further medical evaluation. CAUSES  Palpitations can be caused by:  Smoking.  Caffeine or other stimulants, such as diet pills or energy drinks.  Alcohol.  Stress and anxiety.  Strenuous physical activity.  Fatigue.  Certain medicines.  Heart disease, especially if you have a history of irregular heart rhythms (arrhythmias), such as atrial fibrillation, atrial flutter, or supraventricular tachycardia.  An improperly working pacemaker or defibrillator. DIAGNOSIS  To find the cause of your palpitations, your health care provider will take your medical history and perform a physical exam. Your health care provider may also have you take a test called an ambulatory electrocardiogram (ECG). An ECG records your heartbeat patterns over a 24-hour period. You may also have other tests, such as:  Transthoracic echocardiogram (TTE). During echocardiography, sound waves are used to evaluate how blood flows through your heart.  Transesophageal echocardiogram (TEE).  Cardiac monitoring. This allows your health care provider to monitor your heart rate and rhythm in real time.  Holter monitor. This is a portable device that records your heartbeat and can help diagnose heart arrhythmias. It allows your health care provider to track your heart activity for several days, if needed.  Stress tests by exercise or by giving medicine that makes the heart beat  faster. TREATMENT  Treatment of palpitations depends on the cause of your symptoms and can vary greatly. Most cases of palpitations do not require any treatment other than time, relaxation, and monitoring your symptoms. Other causes, such as atrial fibrillation, atrial flutter, or supraventricular tachycardia, usually require further treatment. HOME CARE INSTRUCTIONS   Avoid:  Caffeinated coffee, tea, soft drinks, diet pills, and energy drinks.  Chocolate.  Alcohol.  Stop smoking if you smoke.  Reduce your stress and anxiety. Things that can help you relax include:  A method of controlling things in your body, such as your heartbeats, with your mind (biofeedback).  Yoga.  Meditation.  Physical activity such as swimming, jogging, or walking.  Get plenty of rest and sleep. SEEK MEDICAL CARE IF:   You continue to have a fast or irregular heartbeat beyond 24 hours.  Your palpitations occur more often. SEEK IMMEDIATE MEDICAL CARE IF:  You have chest pain or shortness of breath.  You have a severe headache.  You feel dizzy or you faint. MAKE SURE YOU:  Understand these instructions.  Will watch your condition.  Will get help right away if you are not doing well or get worse.   This information is not intended to replace advice given to you by your health care provider. Make sure you discuss any questions you have with your health care provider.   Document Released: 11/12/2000 Document Revised: 11/20/2013 Document Reviewed: 01/14/2012 Elsevier Interactive Patient Education [employer]2016 Else[employer] Inc.

## 2016-03-17 NOTE — Progress Notes (Signed)
Assessment and Plan: Anxiety- start on wellbutrin, for concentration and anxiety- if not better with try zoloft Sleep issues- try trazodone, if it does not help can try verapamil GERD- add zantac at night to prilosec in the day Follow up 1 month  HPI 30 y.o.male presents for trouble sleeping/anxiety.  He states he still has some heart burn with prilosec that will wake him up occ. May need referral to GI for GERd to rule out Hpylori.  He states that he has been having some trouble sleeping since being on prednisone over 3 weeks ago, feels anxiety and heart rate will be increased. Worse the last week. GF is here and states he will wake up frequently. Palpitations only at night, not during the day, will have some sweaty with anxiety but denies changes in vision, SOB, CP, dizziness, nausea.  Has some difficulty with concentrating.    Past Medical History  Diagnosis Date  . IBS (irritable bowel syndrome)   . Heart murmur     w/ tachy as child     No Known Allergies    Current Outpatient Prescriptions on File Prior to Visit  Medication Sig Dispense Refill  . glycopyrrolate (ROBINUL) 2 MG tablet Take 1 tablet (2 mg total) by mouth 2 (two) times daily. 60 tablet 3  . omeprazole (PRILOSEC) 40 MG capsule TAKE 1 CAPSULE (40 MG TOTAL) BY MOUTH DAILY. 90 capsule 0   No current facility-administered medications on file prior to visit.    ROS: all negative except above.   Physical Exam: Filed Weights   03/17/16 1056  Weight: 162 lb 3.2 oz (73.573 kg)   BP 110/90 mmHg  Pulse 83  Temp(Src) 97.6 F (36.4 C) (Temporal)  Resp 16  Ht 5\' 8"  (1.727 m)  Wt 162 lb 3.2 oz (73.573 kg)  BMI 24.67 kg/m2  SpO2 96% General Appearance: Well nourished, in no apparent distress. Eyes: PERRLA, EOMs, conjunctiva no swelling or erythema Sinuses: No Frontal/maxillary tenderness ENT/Mouth: Ext aud canals clear, TMs without erythema, bulging. No erythema, swelling, or exudate on post pharynx.  Tonsils not  swollen or erythematous. Hearing normal.  Neck: Supple, thyroid normal.  Respiratory: Respiratory effort normal, BS equal bilaterally without rales, rhonchi, wheezing or stridor.  Cardio: RRR with no MRGs. Brisk peripheral pulses without edema.  Abdomen: Soft, + BS.  Non tender, no guarding, rebound, hernias, masses. Lymphatics: Non tender without lymphadenopathy.  Musculoskeletal: Full ROM, 5/5 strength, normal gait.  Skin: Warm, dry without rashes, lesions, ecchymosis.  Neuro: Cranial nerves intact. Normal muscle tone, no cerebellar symptoms. Sensation intact.  Psych: Awake and oriented X 3, normal affect, Insight and Judgment appropriate.     Quentin MullingAmanda Jayziah Bankhead, PA-C 11:18 AM [facility]

## 2016-04-01 ENCOUNTER — Ambulatory Visit (INDEPENDENT_AMBULATORY_CARE_PROVIDER_SITE_OTHER): Payer: 59 | Admitting: Internal Medicine

## 2016-04-01 ENCOUNTER — Encounter: Payer: Self-pay | Admitting: Internal Medicine

## 2016-04-01 VITALS — BP 106/60 | HR 82 | Temp 98.0°F | Resp 18 | Ht 68.0 in

## 2016-04-01 DIAGNOSIS — W57XXXA Bitten or stung by nonvenomous insect and other nonvenomous arthropods, initial encounter: Secondary | ICD-10-CM | POA: Diagnosis not present

## 2016-04-01 DIAGNOSIS — T148 Other injury of unspecified body region: Secondary | ICD-10-CM | POA: Diagnosis not present

## 2016-04-01 MED ORDER — DOXYCYCLINE HYCLATE 100 MG PO CAPS: 100.0000 mg | ORAL_CAPSULE | Freq: Two times a day (BID) | ORAL | Status: DC

## 2016-04-01 MED ORDER — TRIAMCINOLONE ACETONIDE 0.1 % EX CREA: 1.0000 "application " | TOPICAL_CREAM | Freq: Three times a day (TID) | CUTANEOUS | Status: DC

## 2016-04-01 NOTE — Progress Notes (Signed)
   Subjective:    Patient ID: Alexander White, male    DOB: 11/19/1986, 30 y.o.   MRN: [id number]  HPI  Patient presents to the office for evaluation of tick bite to the right hip on 03/28/16.  He reports that since that time he has had some increased redness and some itching.  He reports that it hasn't changed much.  He reports that he also found another tick bite on the beltline as well.  Review of Systems  Constitutional: Negative for fever, chills and fatigue.  Gastrointestinal: Negative for nausea and vomiting.  Skin: Positive for rash.  Neurological: Positive for headaches. Negative for dizziness and weakness.       Objective:   Physical Exam  Constitutional: He is oriented to person, place, and time. He appears well-developed and well-nourished. No distress.  HENT:  Head: Normocephalic.  Mouth/Throat: Oropharynx is clear and moist. No oropharyngeal exudate.  Eyes: Conjunctivae are normal. No scleral icterus.  Neck: Normal range of motion. Neck supple. No JVD present. No thyromegaly present.  Cardiovascular: Normal rate, regular rhythm, normal heart sounds and intact distal pulses.  Exam reveals no gallop and no friction rub.   No murmur heard. Pulmonary/Chest: Effort normal and breath sounds normal. No respiratory distress. He has no wheezes. He has no rales. He exhibits no tenderness.  Musculoskeletal: Normal range of motion.  Lymphadenopathy:    He has no cervical adenopathy.  Neurological: He is alert and oriented to person, place, and time.  Skin: Skin is warm and dry. He is not diaphoretic.     Nursing note and vitals reviewed.   Filed Vitals:   04/01/16 1102  BP: 106/60  Pulse: 82  Temp: 98 F (36.7 C)  Resp: 18         Assessment & Plan:    1. Tick bite -kenalog -doxycycline BID x 14 days

## 2016-04-04 ENCOUNTER — Other Ambulatory Visit: Payer: Self-pay | Admitting: Internal Medicine

## 2016-04-09 ENCOUNTER — Ambulatory Visit (INDEPENDENT_AMBULATORY_CARE_PROVIDER_SITE_OTHER): Payer: 59 | Admitting: Internal Medicine

## 2016-04-09 VITALS — BP 128/70 | HR 100 | Temp 98.2°F | Resp 18 | Ht 68.0 in | Wt 160.0 lb

## 2016-04-09 DIAGNOSIS — K2100 Gastro-esophageal reflux disease with esophagitis, without bleeding: Secondary | ICD-10-CM

## 2016-04-09 MED ORDER — RANITIDINE HCL 300 MG PO TABS: 300.0000 mg | ORAL_TABLET | Freq: Every day | ORAL | Status: DC

## 2016-04-09 MED ORDER — ONDANSETRON 8 MG PO TBDP: ORAL_TABLET | ORAL | Status: DC

## 2016-04-09 NOTE — Patient Instructions (Addendum)
Please take zofran every 8 hours as needed for nausea.    Please take omeprazole 1st thing in the morning on an empty stomach and once at dinner time.    Please continue carafate at meals and at dinner time.  Please do ranitidine 300 mg in the mornings and 300 mg in the evenings.    Katrina will call you about getting set up with a GI doctor.

## 2016-04-09 NOTE — Progress Notes (Signed)
   Subjective:    Patient ID: Alexander White, male    DOB: 01/18/1986, 30 y.o.   MRN: [id number]  Gastroesophageal Reflux He complains of nausea. He reports no abdominal pain or no chest pain. Pertinent negatives include no fatigue.   Patient presents to the office for worsening acid reflux and some nausea in the mornings.  He reports that he has been taking his acid reflux medications and is noting a lot of post prandial acid reflux and nausea.  He reports that he is constantly nauseated.  He reports that he feels like medications are not helping.  He reports that he even tried to see if he could vomit as well. He reports that he belches a lot.  He reports no diarrhea, constipation, diarrhea, bloody stools or black colored stools.  He has never seen a GI doctor. He is getting married tomorrow and is a little anxious about this.     Review of Systems  Constitutional: Negative for fever, chills and fatigue.  HENT: Negative.   Respiratory: Negative for chest tightness and shortness of breath.   Cardiovascular: Negative for chest pain and palpitations.  Gastrointestinal: Positive for nausea. Negative for vomiting, abdominal pain, diarrhea, constipation, blood in stool and anal bleeding.       Reflux and globulus sensation  Genitourinary: Negative.   Neurological: Negative.   Psychiatric/Behavioral: The patient is nervous/anxious.        Objective:   Physical Exam  Constitutional: He is oriented to person, place, and time. He appears well-developed and well-nourished. No distress.  HENT:  Head: Normocephalic.  Mouth/Throat: Oropharynx is clear and moist. No oropharyngeal exudate.  Eyes: Conjunctivae are normal. No scleral icterus.  Neck: Normal range of motion. Neck supple. No JVD present. No thyromegaly present.  Cardiovascular: Normal rate, regular rhythm, normal heart sounds and intact distal pulses.  Exam reveals no gallop and no friction rub.   No murmur heard. Pulmonary/Chest: Effort  normal and breath sounds normal. No respiratory distress. He has no wheezes. He has no rales. He exhibits no tenderness.  Abdominal: Soft. Bowel sounds are normal. He exhibits no distension and no mass. There is no tenderness. There is no rebound and no guarding.  Musculoskeletal: Normal range of motion.  Lymphadenopathy:    He has no cervical adenopathy.  Neurological: He is alert and oriented to person, place, and time.  Skin: Skin is warm and dry. He is not diaphoretic.  Psychiatric: He has a normal mood and affect. His behavior is normal. Judgment and thought content normal.  Nursing note and vitals reviewed.   Filed Vitals:   04/09/16 0941  BP: 128/70  Pulse: 100  Temp: 98.2 F (36.8 C)  Resp: 18          Assessment & Plan:    1. Gastroesophageal reflux disease with esophagitis -increase omperazole to 40 mg BID x 1 week -increase zantac to BID 300 mg -carafate QID -zofran prn for nausea -small frequent meals -avoid trigger foods -elevate HOB -avoid laying down within two hours of eating - Ambulatory referral to Gastroenterology

## 2016-04-19 ENCOUNTER — Ambulatory Visit: Payer: Self-pay | Admitting: Physician Assistant

## 2016-04-27 ENCOUNTER — Other Ambulatory Visit: Payer: Self-pay | Admitting: Physician Assistant

## 2016-05-17 ENCOUNTER — Encounter: Payer: Self-pay | Admitting: Physician Assistant

## 2016-05-22 ENCOUNTER — Other Ambulatory Visit: Payer: Self-pay | Admitting: Physician Assistant

## 2016-06-21 ENCOUNTER — Other Ambulatory Visit: Payer: Self-pay | Admitting: Physician Assistant

## 2016-06-23 ENCOUNTER — Encounter: Payer: Self-pay | Admitting: Physician Assistant

## 2016-07-08 ENCOUNTER — Encounter: Payer: Self-pay | Admitting: Physician Assistant

## 2016-07-14 ENCOUNTER — Encounter: Payer: Self-pay | Admitting: Physician Assistant

## 2016-08-28 ENCOUNTER — Other Ambulatory Visit: Payer: Self-pay | Admitting: Internal Medicine

## 2016-09-03 ENCOUNTER — Other Ambulatory Visit: Payer: Self-pay | Admitting: Physician Assistant

## 2016-10-03 ENCOUNTER — Encounter: Payer: Self-pay | Admitting: *Deleted

## 2016-11-08 ENCOUNTER — Other Ambulatory Visit: Payer: Self-pay | Admitting: Internal Medicine

## 2016-11-19 ENCOUNTER — Other Ambulatory Visit: Payer: Self-pay | Admitting: Internal Medicine

## 2016-11-19 MED ORDER — OMEPRAZOLE 40 MG PO CPDR: 40.0000 mg | DELAYED_RELEASE_CAPSULE | Freq: Two times a day (BID) | ORAL | 1 refills | Status: DC

## 2016-12-29 ENCOUNTER — Ambulatory Visit: Payer: Self-pay | Admitting: Internal Medicine

## 2016-12-29 ENCOUNTER — Encounter: Payer: Self-pay | Admitting: Internal Medicine

## 2016-12-29 ENCOUNTER — Ambulatory Visit (INDEPENDENT_AMBULATORY_CARE_PROVIDER_SITE_OTHER): Payer: 59 | Admitting: Internal Medicine

## 2016-12-29 VITALS — BP 118/84 | HR 81 | Temp 97.7°F | Resp 16 | Wt 172.0 lb

## 2016-12-29 DIAGNOSIS — J069 Acute upper respiratory infection, unspecified: Secondary | ICD-10-CM

## 2016-12-29 MED ORDER — BENZONATATE 200 MG PO CAPS: 200.0000 mg | ORAL_CAPSULE | Freq: Three times a day (TID) | ORAL | 0 refills | Status: DC | PRN

## 2016-12-29 MED ORDER — FLUTICASONE PROPIONATE 50 MCG/ACT NA SUSP: 2.0000 | Freq: Every day | NASAL | 0 refills | Status: DC

## 2016-12-29 MED ORDER — AZELASTINE HCL 0.1 % NA SOLN: 2.0000 | Freq: Two times a day (BID) | NASAL | 2 refills | Status: DC

## 2016-12-29 MED ORDER — PREDNISONE 20 MG PO TABS: ORAL_TABLET | ORAL | 0 refills | Status: DC

## 2016-12-29 MED ORDER — AZITHROMYCIN 250 MG PO TABS: ORAL_TABLET | ORAL | 0 refills | Status: DC

## 2016-12-29 MED ORDER — ERYTHROMYCIN 5 MG/GM OP OINT: TOPICAL_OINTMENT | Freq: Two times a day (BID) | OPHTHALMIC | 0 refills | Status: AC

## 2016-12-29 NOTE — Patient Instructions (Signed)
Please take the zpak until it is completely gone.  Take it with food.    Please use astelin 2 sprays per nostril in both the mornings and the evenings.    Please use flonase 2 sprays per nostril about 10 minutes after your do the evening dose of the astelin.    Please take a claritin, zyrtec, or allegra daily to help dry up some of the congestion.  You can continue to take mucinex if you desire.   Please use the erythromycin eye ointment twice daily.  Pull your lower eyelid down and place a 1/2 inch ribbon of the medicine.  Close your eye and massage the ointment in.  It will make your vision slightly blurry temporarily.  Use for 3-5 days.  Please take tylenol or ibuprofen as needed.    You can use tessalon as needed for coughing up to 3 times per day.

## 2016-12-29 NOTE — Progress Notes (Signed)
HPI  Patient presents to the office for evaluation of cough.  It has been going on for 4 days.  Patient reports night > day, wet, worse with lying down, yellow sputum production similar to what he is blowing out of his nose.  They also endorse change in voice, chills, postnasal drip, sputum production and nasal congestion, sore throat, ear pain, eye drainage from the left eye, and fatigue.  .  They have tried mucinex, Vit C, 1/2 tablet of prednisone.  They report that nothing has worked.  They admits to other sick contacts.  Review of Systems  Constitutional: Positive for malaise/fatigue. Negative for chills and fever.  HENT: Positive for congestion, ear pain, hearing loss and sore throat.   Respiratory: Positive for cough. Negative for sputum production, shortness of breath and wheezing.   Cardiovascular: Negative for chest pain, palpitations and leg swelling.  Neurological: Positive for headaches.    PE:  Vitals:   12/29/16 1049  BP: 118/84  Pulse: 81  Resp: 16  Temp: 97.7 F (36.5 C)   General:  Alert and non-toxic, WDWN, NAD HEENT: NCAT, PERLA, EOM normal, left medial canthus erythematous with crusting to the medial lower eye lid.  Mucopurulent material present.  Minimal Conjunctival injection to the left eye. Nasal mucosal edema with sinus tenderness to palpation.  Oropharynx clear with minimal oropharyngeal edema and erythema.  Mucous membranes moist and pink. Neck:  Cervical adenopathy Chest:  RRR no MRGs.  Lungs clear to auscultation A&P with no wheezes rhonchi or rales.   Abdomen: +BS x 4 quadrants, soft, non-tender, no guarding, rigidity, or rebound. Skin: warm and dry no rash Neuro: A&Ox4, CN II-XII grossly intact  Assessment and Plan:   1. Acute URI -zpak -flonase -astelin -daily antihistamine -avoid prednisone use if possible -romycin as possible bacterial conjunctivitis of the left eye

## 2017-04-19 ENCOUNTER — Other Ambulatory Visit: Payer: Self-pay

## 2017-04-19 MED ORDER — TRAZODONE HCL 50 MG PO TABS: ORAL_TABLET | ORAL | 0 refills | Status: DC

## 2017-04-19 MED ORDER — BUPROPION HCL ER (XL) 150 MG PO TB24: 150.0000 mg | ORAL_TABLET | ORAL | 0 refills | Status: DC

## 2017-06-28 ENCOUNTER — Other Ambulatory Visit: Payer: Self-pay | Admitting: *Deleted

## 2017-06-28 MED ORDER — GLYCOPYRROLATE 2 MG PO TABS: ORAL_TABLET | ORAL | 0 refills | Status: DC

## 2017-08-08 ENCOUNTER — Other Ambulatory Visit: Payer: Self-pay | Admitting: *Deleted

## 2017-08-08 ENCOUNTER — Other Ambulatory Visit: Payer: Self-pay | Admitting: Physician Assistant

## 2017-08-08 MED ORDER — OMEPRAZOLE 40 MG PO CPDR: DELAYED_RELEASE_CAPSULE | ORAL | 1 refills | Status: DC

## 2017-08-31 ENCOUNTER — Other Ambulatory Visit: Payer: Self-pay | Admitting: Physician Assistant

## 2017-09-06 ENCOUNTER — Other Ambulatory Visit: Payer: Self-pay | Admitting: Physician Assistant

## 2017-09-06 ENCOUNTER — Other Ambulatory Visit: Payer: Self-pay | Admitting: Internal Medicine

## 2017-10-17 DIAGNOSIS — F419 Anxiety disorder, unspecified: Secondary | ICD-10-CM | POA: Insufficient documentation

## 2017-10-17 NOTE — Progress Notes (Deleted)
Assessment and Plan:  Diagnoses and all orders for this visit:  Gastroesophageal reflux disease with esophagitis Well managed on current medications Discussed diet, avoiding triggers and other lifestyle changes  Irritable bowel syndrome, unspecified type If not on benefiber then add it, decrease stress,  if any worsening symptoms, blood in stool, AB pain, etc call office  Anxiety Well managed by current regimen; continue medications Stress management techniques discussed, increase water, good sleep hygiene discussed, increase exercise, and increase veggies.   Medication management CBC, BMP with GFR, liver function   Further disposition pending results of labs. Discussed med's effects and SE's.   Over 30 minutes of exam, counseling, chart review, and critical decision making was performed.   Future Appointments  Date Time Provider Department Center  10/18/2017  4:00 PM Judd Gaudierorbett, Champagne Paletta, NP [facility] None    ------------------------------------------------------------------------------------------------------------------   HPI There were no vitals taken for this visit.  31 y.o.male presents for follow up on chronic medical conditions of anxiety, IBS, and GERD.  NEEDS CPE***  he is currently on wellbutrin for anxiety and for concentration, trazodone for sleep, reports symptoms are*** well controlled on current regimen.  - if not better with try zoloft Sleep issues- try trazodone, if it does not help can try verapamil GERD- add zantac at night to prilosec in the day  he has a diagnosis of GERD which is currently managed by *** he reports symptoms {ACTION; ARE/ARE [id number]} currently well controlled, and denies breakthrough reflux, burning in chest, hoarseness or cough.     Past Medical History:  Diagnosis Date  . Heart murmur    w/ tachy as child  . IBS (irritable bowel syndrome)      No Known Allergies  Current Outpatient Medications on File Prior to Visit   Medication Sig  . azelastine (ASTELIN) 0.1 % nasal spray Place 2 sprays into both nostrils 2 (two) times daily. Use in each nostril as directed  . azithromycin (ZITHROMAX Z-PAK) 250 MG tablet 2 po day one, then 1 daily x 4 days  . benzonatate (TESSALON) 200 MG capsule Take 1 capsule (200 mg total) by mouth 3 (three) times daily as needed for cough.  Marland Kitchen. buPROPion (WELLBUTRIN XL) 150 MG 24 hr tablet TAKE 1 TABLET BY MOUTH ONCE DAILY IN THE MORNING  . fluticasone (FLONASE) 50 MCG/ACT nasal spray Place 2 sprays into both nostrils daily.  Marland Kitchen. glycopyrrolate (ROBINUL) 2 MG tablet TAKE 1 TABLET BY MOUTH TWICE DAILY  . omeprazole (PRILOSEC) 40 MG capsule TAKE 1 CAPSULE (40 MG TOTAL) BY MOUTH DAILY.  Marland Kitchen. ondansetron (ZOFRAN ODT) 8 MG disintegrating tablet 8mg  ODT q4 hours prn nausea  . predniSONE (DELTASONE) 20 MG tablet 3 tabs po day one, then 2 tabs daily x 4 days  . ranitidine (ZANTAC) 300 MG tablet TAKE 1 TABLET (300 MG TOTAL) BY MOUTH AT BEDTIME.  . Sucralfate (CARAFATE PO) Take 10 mLs by mouth 4 (four) times daily as needed.  . traZODone (DESYREL) 50 MG tablet TAKE 1/2 TO 1 TABLET BY MOUTH DAILY FOR SLEEP   No current facility-administered medications on file prior to visit.     ROS: ROS   Physical Exam:  There were no vitals taken for this visit.  General Appearance: Well nourished, in no apparent distress. Eyes: PERRLA, EOMs, conjunctiva no swelling or erythema Sinuses: No Frontal/maxillary tenderness ENT/Mouth: Ext aud canals clear, TMs without erythema, bulging. No erythema, swelling, or exudate on post pharynx.  Tonsils not swollen or erythematous. Hearing normal.  Neck: Supple,  thyroid normal.  Respiratory: Respiratory effort normal, BS equal bilaterally without rales, rhonchi, wheezing or stridor.  Cardio: RRR with no MRGs. Brisk peripheral pulses without edema.  Abdomen: Soft, + BS.  Non tender, no guarding, rebound, hernias, masses. Lymphatics: Non tender without lymphadenopathy.   Musculoskeletal: Full ROM, 5/5 strength, normal gait.  Skin: Warm, dry without rashes, lesions, ecchymosis.  Neuro: Cranial nerves intact. Normal muscle tone, no cerebellar symptoms. Sensation intact.  Psych: Awake and oriented X 3, normal affect, Insight and Judgment appropriate.     Dan MakerAshley C Lahna Nath, NP 6:30 PM [facility]

## 2017-10-18 ENCOUNTER — Ambulatory Visit: Payer: Self-pay | Admitting: Adult Health

## 2017-11-07 ENCOUNTER — Ambulatory Visit: Payer: Self-pay | Admitting: Adult Health

## 2017-12-22 ENCOUNTER — Other Ambulatory Visit: Payer: Self-pay | Admitting: Physician Assistant

## 2018-01-04 ENCOUNTER — Other Ambulatory Visit: Payer: Self-pay | Admitting: Physician Assistant

## 2018-01-23 ENCOUNTER — Ambulatory Visit: Payer: 59 | Admitting: Adult Health

## 2018-01-23 ENCOUNTER — Encounter: Payer: Self-pay | Admitting: Adult Health

## 2018-01-23 VITALS — BP 118/84 | HR 107 | Temp 98.1°F | Ht 68.0 in | Wt 169.0 lb

## 2018-01-23 DIAGNOSIS — R251 Tremor, unspecified: Secondary | ICD-10-CM

## 2018-01-23 DIAGNOSIS — J00 Acute nasopharyngitis [common cold]: Secondary | ICD-10-CM

## 2018-01-23 DIAGNOSIS — R05 Cough: Secondary | ICD-10-CM

## 2018-01-23 DIAGNOSIS — R058 Other specified cough: Secondary | ICD-10-CM

## 2018-01-23 MED ORDER — AZITHROMYCIN 250 MG PO TABS: ORAL_TABLET | ORAL | 0 refills | Status: DC

## 2018-01-23 MED ORDER — PROMETHAZINE-DM 6.25-15 MG/5ML PO SYRP: 5.0000 mL | ORAL_SOLUTION | Freq: Four times a day (QID) | ORAL | 1 refills | Status: DC | PRN

## 2018-01-23 MED ORDER — NADOLOL 40 MG PO TABS: ORAL_TABLET | ORAL | 2 refills | Status: DC

## 2018-01-23 MED ORDER — PREDNISONE 20 MG PO TABS: ORAL_TABLET | ORAL | 0 refills | Status: DC

## 2018-01-23 NOTE — Progress Notes (Signed)
Assessment and Plan:  Alexander White was seen today for uri and shaking.  Diagnoses and all orders for this visit:  Acute nasopharyngitis - Discussed the importance of avoiding unnecessary antibiotic therapy. Suggested symptomatic OTC remedies. Nasal saline spray for congestion. Nasal steroids, allergy pill, oral steroids Follow up as needed. -     predniSONE (DELTASONE) 20 MG tablet; 3 tabs po day one, then 2 tabs daily x 4 days -     promethazine-dextromethorphan (PROMETHAZINE-DM) 6.25-15 MG/5ML syrup; Take 5 mLs by mouth 4 (four) times daily as needed for cough.  Fill if congestion progresses, duration of greater than 9 days, develop fever:        -     azithromycin (ZITHROMAX Z-PAK) 250 MG tablet; 2 po day one, then 1 daily x 4 days  Tremor of both hands ? 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 Get on allergy pill, voice rest, suppress cough with OTC sugar free candy and RX med.   Follow up 1 month.    Further disposition pending results of labs. Discussed med's effects and SE's.   Over 30 minutes of exam, counseling, chart review, and critical decision making was performed.   No future appointments.  ------------------------------------------------------------------------------------------------------------------   HPI BP 118/84   Pulse (!) 107   Temp 98.1 F (36.7 C)   Ht 5\' 8"  (1.727 m)   Wt 169 lb (76.7 kg)   SpO2 97%   BMI 25.70 kg/m   32 y.o.male presents for cough ongoing for over a year intermittently, non-productive, worse "after showering." He reports it seems to be worse in the past week with increased purulence - yellow/green. He also endorses nasal congestion, sneezing, and a single day of sore throat yesterday. Has  taken some OTC cough medication last night without much improvement  No respiratory hx, not a smoker. He does have hx of GERD currently on prilosec 40 mg daily. Denies increased reflux  He also endorses new tremor or bilateral hands which he has noted when shooting his gun x 3 weeks. Works as a Engineer, [employer]. He reports some caffeine intake through coffee/soda. No known family hx of tremors.    Past Medical History:  Diagnosis Date  . Heart murmur    w/ tachy as child  . IBS (irritable bowel syndrome)      No Known Allergies  Current Outpatient Medications on File Prior to Visit  Medication Sig  . buPROPion (WELLBUTRIN XL) 150 MG 24 hr tablet TAKE 1 TABLET BY MOUTH ONCE DAILY IN THE MORNING  . omeprazole (PRILOSEC) 40 MG capsule TAKE 1 CAPSULE BY MOUTH ONCE DAILY  . traZODone (DESYREL) 50 MG tablet TAKE 1/2 TO 1 TABLET BY MOUTH DAILY FOR SLEEP  . azelastine (ASTELIN) 0.1 % nasal spray Place 2 sprays into both nostrils 2 (two) times daily. Use in each nostril as directed   No current facility-administered medications on file prior to visit.     ROS: Review of Systems  Constitutional: Negative for chills, diaphoresis, fever and malaise/fatigue.  HENT: Positive for congestion. Negative for ear discharge, ear pain, hearing loss, sinus pain, sore throat and tinnitus.   Eyes: Negative for blurred vision, pain, discharge and redness.  Respiratory: Positive for cough. Negative for  hemoptysis, sputum production, shortness of breath, wheezing and stridor.   Cardiovascular: Negative for chest pain, palpitations and orthopnea.  Gastrointestinal: Negative for abdominal pain, diarrhea, heartburn, nausea and vomiting.  Genitourinary: Negative.   Musculoskeletal: Negative for joint pain and myalgias.  Skin: Negative for rash.  Neurological: Positive for tremors (Bilateral hands x 3 weeks) and focal weakness. Negative for dizziness, sensory change, weakness and headaches.  Endo/Heme/Allergies: Negative for  environmental allergies.  Psychiatric/Behavioral: Negative.  Negative for depression and substance abuse. The patient is not nervous/anxious and does not have insomnia.   All other systems reviewed and are negative.   Physical Exam:  BP 118/84   Pulse (!) 107   Temp 98.1 F (36.7 C)   Ht 5\' 8"  (1.727 m)   Wt 169 lb (76.7 kg)   SpO2 97%   BMI 25.70 kg/m   General Appearance: Well nourished, in no apparent distress. Eyes: PERRLA, EOMs, conjunctiva no swelling or erythema Sinuses: No Frontal/maxillary tenderness ENT/Mouth: Ext aud canals clear, TMs without erythema, bulging. No erythema, swelling, or exudate on post pharynx.  Tonsils not swollen or erythematous. Hearing normal.  Neck: Supple, thyroid normal.  Respiratory: Respiratory effort normal, BS equal bilaterally without rales, rhonchi, wheezing or stridor.  Cardio: RRR with no MRGs. Brisk peripheral pulses without edema.  Abdomen: Soft, + BS.  Non tender, no guarding, rebound, hernias, masses. Lymphatics: Non tender without lymphadenopathy.  Musculoskeletal: Full ROM, 5/5 strength, normal gait.  Skin: Warm, dry without rashes, lesions, ecchymosis.  Neuro: Cranial nerves intact. Normal muscle tone, no cerebellar symptoms. Sensation intact. Slight tremor of bilateral hands with extension -  Psych: Awake and oriented X 3, normal affect, Insight and Judgment appropriate.     Dan Maker, NP 1:13 PM [facility]

## 2018-01-23 NOTE — Patient Instructions (Signed)
Common causes of cough OR hoarseness OR sore throat:   Allergies, Viral Infections, Acid Reflux and Bacterial Infections.   Allergies and viral infections cause a cough OR sore throat by post nasal drip and are often worse at night, can also have sneezing, lower grade fevers, clear/yellow mucus. This is best treated with allergy medications or nasal sprays.  Please get on allegra for 1-2 weeks The strongest is allegra or fexafinadine  Cheapest at [employer], [employer], [employer]   Bacterial infections are more severe than allergies or viral infections with fever, teeth pain, fatigue. This can be treated with prednisone and the same over the counter medication and after 7 days can be treated with an antibiotic.   Silent reflux/GERD can cause a cough OR sore throat OR hoarseness WITHOUT heart burn because the esophagus that goes to the stomach and trachea that goes to the lungs are very close and when you lay down the acid can irritate your throat and lungs. This can cause hoarseness, cough, and wheezing. Please stop any alcohol or anti-inflammatories like aleve/advil/ibuprofen and start an over the counter Prilosec or omeprazole 1-2 times daily 30mins before food for 2 weeks, then switch to over the counter zantac/ratinidine or pepcid/famotadine once at night for 2 weeks.    sometimes irritation causes more irritation. Try voice rest, use sugar free cough drops to prevent coughing, and try to stop clearing your throat.   If you ever have a cough that does not go away after trying these things please make a follow up visit for further evaluation or we can refer you to a specialist. Or if you ever have shortness of breath or chest pain go to the ER.    

## 2018-02-25 ENCOUNTER — Other Ambulatory Visit: Payer: Self-pay | Admitting: Internal Medicine

## 2018-02-25 DIAGNOSIS — J00 Acute nasopharyngitis [common cold]: Secondary | ICD-10-CM

## 2018-02-25 MED ORDER — BUPROPION HCL ER (XL) 150 MG PO TB24: ORAL_TABLET | ORAL | 0 refills | Status: DC

## 2018-05-13 ENCOUNTER — Other Ambulatory Visit: Payer: Self-pay | Admitting: Internal Medicine

## 2018-06-26 DIAGNOSIS — M65849 Other synovitis and tenosynovitis, unspecified hand: Secondary | ICD-10-CM | POA: Diagnosis not present

## 2018-07-25 ENCOUNTER — Other Ambulatory Visit: Payer: Self-pay | Admitting: Internal Medicine

## 2018-08-02 ENCOUNTER — Other Ambulatory Visit: Payer: Self-pay | Admitting: Internal Medicine

## 2018-08-30 ENCOUNTER — Other Ambulatory Visit: Payer: Self-pay | Admitting: Adult Health

## 2018-10-02 ENCOUNTER — Other Ambulatory Visit: Payer: Self-pay | Admitting: Internal Medicine

## 2018-11-03 ENCOUNTER — Other Ambulatory Visit: Payer: Self-pay | Admitting: Internal Medicine

## 2018-11-17 DIAGNOSIS — J0100 Acute maxillary sinusitis, unspecified: Secondary | ICD-10-CM | POA: Diagnosis not present

## 2018-11-30 ENCOUNTER — Telehealth: Payer: Self-pay

## 2018-11-30 ENCOUNTER — Other Ambulatory Visit: Payer: Self-pay | Admitting: Adult Health

## 2018-11-30 DIAGNOSIS — R251 Tremor, unspecified: Secondary | ICD-10-CM

## 2018-11-30 MED ORDER — NADOLOL 40 MG PO TABS: ORAL_TABLET | ORAL | 0 refills | Status: DC

## 2018-11-30 NOTE — Telephone Encounter (Signed)
Refill request for Nadolol. Last appointment was on 01/24/28. No upcoming appointments scheduled

## 2018-12-04 NOTE — Telephone Encounter (Signed)
Patient notified about making appointment and is scheduled to be seen on 12/15/18 for a CPE

## 2018-12-14 DIAGNOSIS — G4700 Insomnia, unspecified: Secondary | ICD-10-CM | POA: Insufficient documentation

## 2018-12-14 NOTE — Progress Notes (Signed)
Complete Physical  Assessment and Plan:  Alexander White was seen today for annual exam.  Diagnoses and all orders for this visit:  Encounter for routine adult health examination without abnormal findings  Irritable bowel syndrome, unspecified type If not on benefiber then add it, decrease stress,  if any worsening symptoms, blood in stool, AB pain, etc call office Reports improved with robinul 2 mg daily; BID caused dry mouth  Gastroesophageal reflux disease with esophagitis -     Magnesium -     omeprazole (PRILOSEC) 40 MG capsule; Take 1 capsule (40 mg total) by mouth daily.  Tremor of both hands Well controlled by BB; avoid excess sugar, caffeine, stress/sleep management discussed -     nadolol (CORGARD) 40 MG tablet; Start 40 mg daily for tremors, may increase slowly to max of 120 mg daily. Check blood pressure to make sure not getting too low.  Anxiety Wellbutrin helpful with focus though remains prone to anxiety; discussed trial of low dose SSRI to stabilize, he is agreeable to trial of this; discussed expected SE during the initial 2 weeks, full benefit needs 8-12 week trial duration. We will follow up in 12 weeks.  Stress management techniques discussed, increase water, good sleep hygiene discussed, increase exercise, and increase veggies.  -     citalopram (CELEXA) 10 MG tablet; Take 1 tablet (10 mg total) by mouth daily.  Insomnia, unspecified type Secondary to anxiety and shift work; trazodone is beneficial good sleep hygiene discussed, increase day time activity, try melatonin or benadryl if this does not help we will call in sleep medication.   BMI 24.0-24.9,adult  Medication management -     CBC with Differential/Platelet -     COMPLETE METABOLIC PANEL WITH GFR -     Urinalysis, Routine w reflex microscopic -     Magnesium  Screening cholesterol level -     Lipid panel  Screening for thyroid disorder -     TSH  Screening for diabetes mellitus -     Hemoglobin  A1c  Vitamin D deficiency -     VITAMIN D 25 Hydroxy (Vit-D Deficiency, Fractures)  Need for prophylactic vaccination with tetanus-diphtheria (Td) -     Td : Tetanus/diphtheria >7yo Preservative  free   Discussed med's effects and SE's. Screening labs and tests as requested with regular follow-up as recommended. Over 40 minutes of exam, counseling, chart review and critical decision making was performed  Future Appointments  Date Time Provider Department Center  12/20/2019 10:00 AM Judd Gaudier, NP [facility] None     HPI 33 y.o. Male, no kids, has a dog, married, works as Midwife presents for a complete physical. He has GERD (gastroesophageal reflux disease); IBS (irritable bowel syndrome); Anxiety; Tremor of both hands; and Insomnia on their problem list.  He has bilateral hand tremor that is significantly improved with nadolol 40 mg daily which fully resolves tremors.   He has IBS with both diarrhea and constipation, he takes robinul 2 mg daily which significantly imroproves symptoms.   He has insomnia r/t shift work and anxiety and is prescribed trazodone 50 mg which he reports is beneficial most days. He is also prescribed wellbutrin for mood and to help with focus.   he has a diagnosis of GERD which is currently managed by prilosec 40 mg 1-2 daily.  he reports symptoms is currently well controlled, and denies breakthrough reflux, burning in chest, hoarseness or cough.    BMI is Body mass index is 24.02 kg/m.,  he has been working on diet and exercise. Typically eats breakfast and dinner with light snacking for lunch. Drinks soda (mountain dew) occasionally on night shift if needed, usually only a few sips. Some sweet tea, mostly water.  Wt Readings from Last 3 Encounters:  12/15/18 158 lb (71.7 kg)  01/23/18 169 lb (76.7 kg)  12/29/16 172 lb (78 kg)   Today their BP is BP: 106/70 He does workout. He denies chest pain, shortness of breath, dizziness.    Current  Medications:  Current Outpatient Medications on File Prior to Visit  Medication Sig Dispense Refill  . glycopyrrolate (ROBINUL) 2 MG tablet TAKE 1 TABLET BY MOUTH TWICE DAILY 180 tablet 1  . traZODone (DESYREL) 50 MG tablet TAKE 1/2-1 TABLET BY MOUTH DAILY FOR SLEEP 90 tablet 1   No current facility-administered medications on file prior to visit.    Allergies:  No Known Allergies Health Maintenance:  Immunization History  Administered Date(s) Administered  . HPV Quadrivalent 11/01/2012  . Td 12/15/2018  . Tdap 11/29/2006    Tetanus: 2008 DUE  Flu vaccine: declines  HPV: 2013   Colonoscopy: -  EGD: -  Eye Exam: [facility], last 2016, wears glasses Dentist: Dr. Robby Sermonebecca Howe, last visit 2019, goes q2163m  Patient Care Team: Lucky CowboyMcKeown, William, MD as PCP - General (Internal Medicine)  Medical History:  has GERD (gastroesophageal reflux disease); IBS (irritable bowel syndrome); Anxiety; Tremor of both hands; and Insomnia on their problem list. Surgical History:  He  has no past surgical history on file. Family History:  His family history includes CAD in his paternal grandfather; Cancer in his maternal grandmother; Dementia in his maternal grandmother; Depression in his brother and mother; Diabetes in his maternal grandmother and paternal grandfather; Heart attack (age of onset: 6265) in his father; Hyperlipidemia in his mother; Hypertension in his maternal grandmother; Irritable bowel syndrome in his mother; Lymphoma in his paternal grandmother; Stroke in his maternal grandfather. Social History:   reports that he has never smoked. He has never used smokeless tobacco. He reports that he does not drink alcohol or use drugs.   Review of Systems:    Review of Systems  Constitutional: Negative for malaise/fatigue and weight loss.  HENT: Negative for hearing loss and tinnitus.   Eyes: Negative for blurred vision and double vision.  Respiratory: Negative for cough, sputum production,  shortness of breath and wheezing.   Cardiovascular: Negative for chest pain, palpitations, orthopnea, claudication, leg swelling and PND.  Gastrointestinal: Negative for abdominal pain, blood in stool, constipation, diarrhea, heartburn, melena, nausea and vomiting.  Genitourinary: Negative.   Musculoskeletal: Negative for falls, joint pain and myalgias.  Skin: Negative for rash.  Neurological: Positive for tremors (well controlled on nadolol). Negative for dizziness, tingling, sensory change, weakness and headaches.  Endo/Heme/Allergies: Negative for polydipsia.  Psychiatric/Behavioral: Negative for depression, memory loss, substance abuse and suicidal ideas. 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   All other systems reviewed and are negative.   Physical Exam: Estimated body mass index is 24.02 kg/m as calculated from the following:   Height as of this encounter: 5\' 8"  (1.727 m).   Weight as of this encounter: 158 lb (71.7 kg). BP 106/70   Pulse 65   Temp 97.9 F (36.6 C)   Ht 5\' 8"  (1.727 m)   Wt 158 lb (71.7 kg)   SpO2 96%   BMI 24.02 kg/m  General Appearance: Well nourished, in no apparent distress.  Eyes: PERRLA, EOMs,  conjunctiva no swelling or erythema, normal fundi and vessels.  Sinuses: No Frontal/maxillary tenderness  ENT/Mouth: Ext aud canals clear, normal light reflex with TMs without erythema, bulging. Good dentition. No erythema, swelling, or exudate on post pharynx. Tonsils not swollen or erythematous. Hearing normal.  Neck: Supple, thyroid normal. No bruits  Respiratory: Respiratory effort normal, BS equal bilaterally without rales, rhonchi, wheezing or stridor.  Cardio: RRR without murmurs, rubs or gallops. Brisk peripheral pulses without edema.  Chest: symmetric, with normal excursions and percussion.  Abdomen: Soft, nontender, no guarding, rebound, hernias, masses, or organomegaly.  Lymphatics: Non tender without lymphadenopathy.   Genitourinary: Defer, no issues Musculoskeletal: Full ROM all peripheral extremities,5/5 strength, and normal gait.  Skin: Warm, dry without rashes, lesions, ecchymosis. Neuro: Cranial nerves intact, reflexes equal bilaterally. Normal muscle tone, no cerebellar symptoms. Sensation intact. No tremor observed today.  Psych: Awake and oriented X 3, normal affect, Insight and Judgment appropriate.   EKG: Baseline in computer from 2016 - NSR - defer  Dan Maker 12:13 PM [facility]

## 2018-12-15 ENCOUNTER — Ambulatory Visit: Payer: 59 | Admitting: Adult Health

## 2018-12-15 ENCOUNTER — Other Ambulatory Visit: Payer: Self-pay | Admitting: Adult Health

## 2018-12-15 ENCOUNTER — Encounter: Payer: Self-pay | Admitting: Adult Health

## 2018-12-15 VITALS — BP 106/70 | HR 65 | Temp 97.9°F | Ht 68.0 in | Wt 158.0 lb

## 2018-12-15 DIAGNOSIS — Z1329 Encounter for screening for other suspected endocrine disorder: Secondary | ICD-10-CM

## 2018-12-15 DIAGNOSIS — Z23 Encounter for immunization: Secondary | ICD-10-CM

## 2018-12-15 DIAGNOSIS — Z79899 Other long term (current) drug therapy: Secondary | ICD-10-CM

## 2018-12-15 DIAGNOSIS — E559 Vitamin D deficiency, unspecified: Secondary | ICD-10-CM | POA: Diagnosis not present

## 2018-12-15 DIAGNOSIS — K2100 Gastro-esophageal reflux disease with esophagitis, without bleeding: Secondary | ICD-10-CM

## 2018-12-15 DIAGNOSIS — R251 Tremor, unspecified: Secondary | ICD-10-CM

## 2018-12-15 DIAGNOSIS — Z0000 Encounter for general adult medical examination without abnormal findings: Secondary | ICD-10-CM

## 2018-12-15 DIAGNOSIS — G4700 Insomnia, unspecified: Secondary | ICD-10-CM

## 2018-12-15 DIAGNOSIS — Z6824 Body mass index (BMI) 24.0-24.9, adult: Secondary | ICD-10-CM

## 2018-12-15 DIAGNOSIS — F419 Anxiety disorder, unspecified: Secondary | ICD-10-CM

## 2018-12-15 DIAGNOSIS — Z131 Encounter for screening for diabetes mellitus: Secondary | ICD-10-CM

## 2018-12-15 DIAGNOSIS — Z13220 Encounter for screening for lipoid disorders: Secondary | ICD-10-CM

## 2018-12-15 DIAGNOSIS — K589 Irritable bowel syndrome without diarrhea: Secondary | ICD-10-CM

## 2018-12-15 MED ORDER — NADOLOL 40 MG PO TABS: ORAL_TABLET | ORAL | 1 refills | Status: DC

## 2018-12-15 MED ORDER — CITALOPRAM HYDROBROMIDE 10 MG PO TABS: 10.0000 mg | ORAL_TABLET | Freq: Every day | ORAL | 2 refills | Status: DC

## 2018-12-15 MED ORDER — OMEPRAZOLE 40 MG PO CPDR: 40.0000 mg | DELAYED_RELEASE_CAPSULE | Freq: Every day | ORAL | 1 refills | Status: DC

## 2018-12-15 NOTE — Patient Instructions (Addendum)
Try adding soluble fiber (citrucel, benefiber)  Get flu vaccine at pharmacy    Goals    . DIET - INCREASE WATER INTAKE     80+ fluid ounces daily       Know what a healthy weight is for you (roughly BMI <25) and aim to maintain this  Aim for 7+ servings of fruits and vegetables daily  65-80+ fluid ounces of water or unsweet tea for healthy kidneys  Limit to max 1 drink of alcohol per day; avoid smoking/tobacco  Limit animal fats in diet for cholesterol and heart health - choose grass fed whenever available  Avoid highly processed foods, and foods high in saturated/trans fats  Aim for low stress - take time to unwind and care for your mental health  Aim for 150 min of moderate intensity exercise weekly for heart health, and weights twice weekly for bone health  Aim for 7-9 hours of sleep daily    Citalopram tablets What is this medicine? CITALOPRAM (sye TAL oh pram) is a medicine for depression. This medicine may be used for other purposes; ask your health care provider or pharmacist if you have questions. COMMON BRAND NAME(S): Celexa What should I tell my health care provider before I take this medicine? They need to know if you have any of these conditions: -bleeding disorders -bipolar disorder or a family history of bipolar disorder -glaucoma -heart disease -history of irregular heartbeat -kidney disease -liver disease -low levels of magnesium or potassium in the blood -receiving electroconvulsive therapy -seizures -suicidal thoughts, plans, or attempt; a previous suicide attempt by you or a family member -take medicines that treat or prevent blood clots -thyroid disease -an unusual or allergic reaction to citalopram, escitalopram, other medicines, foods, dyes, or preservatives -pregnant or trying to become pregnant -breast-feeding How should I use this medicine? Take this medicine by mouth with a glass of water. Follow the directions on the prescription  label. You can take it with or without food. Take your medicine at regular intervals. Do not take your medicine more often than directed. Do not stop taking this medicine suddenly except upon the advice of your doctor. Stopping this medicine too quickly may cause serious side effects or your condition may worsen. A special MedGuide will be given to you by the pharmacist with each prescription and refill. Be sure to read this information carefully each time. Talk to your pediatrician regarding the use of this medicine in children. Special care may be needed. Patients over 59 years old may have a stronger reaction and need a smaller dose. Overdosage: If you think you have taken too much of this medicine contact a poison control center or emergency room at once. NOTE: This medicine is only for you. Do not share this medicine with others. What if I miss a dose? If you miss a dose, take it as soon as you can. If it is almost time for your next dose, take only that dose. Do not take double or extra doses. What may interact with this medicine? Do not take this medicine with any of the following medications: -certain medicines for fungal infections like fluconazole, itraconazole, ketoconazole, posaconazole, voriconazole -cisapride -dofetilide -dronedarone -escitalopram -linezolid -MAOIs like Carbex, Eldepryl, Marplan, Nardil, and Parnate -methylene blue (injected into a vein) -pimozide -thioridazine -ziprasidone This medicine may also interact with the following medications: -alcohol -amphetamines -aspirin and aspirin-like medicines -carbamazepine -certain medicines for depression, anxiety, or psychotic disturbances -certain medicines for infections like chloroquine, clarithromycin, erythromycin, furazolidone, isoniazid, pentamidine -  certain medicines for migraine headaches like almotriptan, eletriptan, frovatriptan, naratriptan, rizatriptan, sumatriptan, zolmitriptan -certain medicines for  sleep -certain medicines that treat or prevent blood clots like dalteparin, enoxaparin, warfarin -cimetidine -diuretics -fentanyl -lithium -methadone -metoprolol -NSAIDs, medicines for pain and inflammation, like ibuprofen or naproxen -omeprazole -other medicines that prolong the QT interval (cause an abnormal heart rhythm) -procarbazine -rasagiline -supplements like St. John's wort, kava kava, valerian -tramadol -tryptophan This list may not describe all possible interactions. Give your health care provider a list of all the medicines, herbs, non-prescription drugs, or dietary supplements you use. Also tell them if you smoke, drink alcohol, or use illegal drugs. Some items may interact with your medicine. What should I watch for while using this medicine? Tell your doctor if your symptoms do not get better or if they get worse. Visit your doctor or health care professional for regular checks on your progress. Because it may take several weeks to see the full effects of this medicine, it is important to continue your treatment as prescribed by your doctor. Patients and their families should watch out for new or worsening thoughts of suicide or depression. Also watch out for sudden changes in feelings such as feeling anxious, agitated, panicky, irritable, hostile, aggressive, impulsive, severely restless, overly excited and hyperactive, or not being able to sleep. If this happens, especially at the beginning of treatment or after a change in dose, call your health care professional. Bonita Quin may get drowsy or dizzy. Do not drive, use machinery, or do anything that needs mental alertness until you know how this medicine affects you. Do not stand or sit up quickly, especially if you are an older patient. This reduces the risk of dizzy or fainting spells. Alcohol may interfere with the effect of this medicine. Avoid alcoholic drinks. Your mouth may get dry. Chewing sugarless gum or sucking hard candy,  and drinking plenty of water will help. Contact your doctor if the problem does not go away or is severe. What side effects may I notice from receiving this medicine? Side effects that you should report to your doctor or health care professional as soon as possible: -allergic reactions like skin rash, itching or hives, swelling of the face, lips, or tongue -anxious -black, tarry stools -breathing problems -changes in vision -chest pain -confusion -elevated mood, decreased need for sleep, racing thoughts, impulsive behavior -eye pain -fast, irregular heartbeat -feeling faint or lightheaded, falls -feeling agitated, angry, or irritable -hallucination, loss of contact with reality -loss of balance or coordination -loss of memory -painful or prolonged erections -restlessness, pacing, inability to keep still -seizures -stiff muscles -suicidal thoughts or other mood changes -trouble sleeping -unusual bleeding or bruising -unusually weak or tired -vomiting Side effects that usually do not require medical attention (report to your doctor or health care professional if they continue or are bothersome): -change in appetite or weight -change in sex drive or performance -dizziness -headache -increased sweating -indigestion, nausea -tremors This list may not describe all possible side effects. Call your doctor for medical advice about side effects. You may report side effects to FDA at 1-800-FDA-1088. Where should I keep my medicine? Keep out of reach of children. Store at room temperature between 15 and 30 degrees C (59 and 86 degrees F). Throw away any unused medicine after the expiration date. NOTE: This sheet is a summary. It may not cover all possible information. If you have questions about this medicine, talk to your doctor, pharmacist, or health care provider.  2019 [employer]vier/Gold Standard (  2016-04-19 13:18:52)  

## 2018-12-16 LAB — COMPLETE METABOLIC PANEL WITH GFR
AG Ratio: 2.1 (calc) (ref 1.0–2.5)
ALBUMIN MSPROF: 4.5 g/dL (ref 3.6–5.1)
ALKALINE PHOSPHATASE (APISO): 76 U/L (ref 40–115)
ALT: 102 U/L — AB (ref 9–46)
AST: 38 U/L (ref 10–40)
BILIRUBIN TOTAL: 0.5 mg/dL (ref 0.2–1.2)
BUN: 13 mg/dL (ref 7–25)
CHLORIDE: 102 mmol/L (ref 98–110)
CO2: 30 mmol/L (ref 20–32)
CREATININE: 1.08 mg/dL (ref 0.60–1.35)
Calcium: 9.6 mg/dL (ref 8.6–10.3)
GFR, Est African American: 105 mL/min/{1.73_m2} (ref >=60)
GFR, Est Non African American: 91 mL/min/{1.73_m2} (ref >=60)
Globulin: 2.1 g/dL (calc) (ref 1.9–3.7)
Glucose, Bld: 79 mg/dL (ref 65–99)
Potassium: 4 mmol/L (ref 3.5–5.3)
Sodium: 139 mmol/L (ref 135–146)
Total Protein: 6.6 g/dL (ref 6.1–8.1)

## 2018-12-16 LAB — CBC WITH DIFFERENTIAL/PLATELET
Absolute Monocytes: 506 cells/uL (ref 200–950)
BASOS PCT: 0.9 %
Basophils Absolute: 50 cells/uL (ref 0–200)
EOS PCT: 1.4 %
Eosinophils Absolute: 77 cells/uL (ref 15–500)
HEMATOCRIT: 46.5 % (ref 38.5–50.0)
Hemoglobin: 16.2 g/dL (ref 13.2–17.1)
LYMPHS ABS: 1452 {cells}/uL (ref 850–3900)
MCH: 29.8 pg (ref 27.0–33.0)
MCHC: 34.8 g/dL (ref 32.0–36.0)
MCV: 85.6 fL (ref 80.0–100.0)
MPV: 12.9 fL — AB (ref 7.5–12.5)
Monocytes Relative: 9.2 %
NEUTROS PCT: 62.1 %
Neutro Abs: 3416 cells/uL (ref 1500–7800)
PLATELETS: 200 10*3/uL (ref 140–400)
RBC: 5.43 10*6/uL (ref 4.20–5.80)
RDW: 12.4 % (ref 11.0–15.0)
Total Lymphocyte: 26.4 %
WBC: 5.5 10*3/uL (ref 3.8–10.8)

## 2018-12-16 LAB — LIPID PANEL
CHOL/HDL RATIO: 4.5 (calc) (ref <5.0)
CHOLESTEROL: 227 mg/dL — AB (ref <200)
HDL: 50 mg/dL (ref >40)
LDL CHOLESTEROL (CALC): 153 mg/dL — AB
Non-HDL Cholesterol (Calc): 177 mg/dL (calc) — ABNORMAL HIGH (ref <130)
Triglycerides: 120 mg/dL (ref <150)

## 2018-12-16 LAB — HEMOGLOBIN A1C
Hgb A1c MFr Bld: 5.2 % of total Hgb (ref <5.7)
MEAN PLASMA GLUCOSE: 103 (calc)
eAG (mmol/L): 5.7 (calc)

## 2018-12-16 LAB — MAGNESIUM: Magnesium: 2 mg/dL (ref 1.5–2.5)

## 2018-12-16 LAB — TSH: TSH: 1.26 mIU/L (ref 0.40–4.50)

## 2018-12-16 LAB — VITAMIN D 25 HYDROXY (VIT D DEFICIENCY, FRACTURES): Vit D, 25-Hydroxy: 22 ng/mL — ABNORMAL LOW (ref 30–100)

## 2018-12-16 LAB — URINALYSIS, ROUTINE W REFLEX MICROSCOPIC
Bilirubin Urine: NEGATIVE
GLUCOSE, UA: NEGATIVE
Hgb urine dipstick: NEGATIVE
Ketones, ur: NEGATIVE
Leukocytes, UA: NEGATIVE
Nitrite: NEGATIVE
Protein, ur: NEGATIVE
Specific Gravity, Urine: 1.021 (ref 1.001–1.03)
pH: 6 (ref 5.0–8.0)

## 2018-12-18 ENCOUNTER — Encounter: Payer: Self-pay | Admitting: Adult Health

## 2018-12-18 DIAGNOSIS — E785 Hyperlipidemia, unspecified: Secondary | ICD-10-CM | POA: Insufficient documentation

## 2018-12-18 DIAGNOSIS — R945 Abnormal results of liver function studies: Secondary | ICD-10-CM

## 2018-12-18 DIAGNOSIS — R7989 Other specified abnormal findings of blood chemistry: Secondary | ICD-10-CM | POA: Insufficient documentation

## 2018-12-18 DIAGNOSIS — E559 Vitamin D deficiency, unspecified: Secondary | ICD-10-CM | POA: Insufficient documentation

## 2019-01-14 ENCOUNTER — Other Ambulatory Visit: Payer: Self-pay | Admitting: Adult Health

## 2019-03-28 NOTE — Progress Notes (Deleted)
FOLLOW UP  Assessment and Plan:   Irritable bowel syndrome, unspecified type If not on benefiber then add it, decrease stress,  if any worsening symptoms, blood in stool, AB pain, etc call office Reports improved with robinul 2 mg daily; BID caused dry mouth  Gastroesophageal reflux disease with esophagitis -     Magnesium -     omeprazole (PRILOSEC) 40 MG capsule; Take 1 capsule (40 mg total) by mouth daily.  Tremor of both hands Well controlled by BB; avoid excess sugar, caffeine, stress/sleep management discussed -     nadolol (CORGARD) 40 MG tablet; Start 40 mg daily for tremors, may increase slowly to max of 120 mg daily. Check blood pressure to make sure not getting too low.  Anxiety Wellbutrin helpful with focus though remained prone to anxiety; discussed trial of low dose SSRI to stabilize *** Stress management techniques discussed, increase water, good sleep hygiene discussed, increase exercise, and increase veggies.  -     citalopram (CELEXA) 10 MG tablet; Take 1 tablet (10 mg total) by mouth daily.  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  Check lipid panel.  -     Lipid panel  Vitamin D Def/ osteoporosis prevention Continue supplementation for goal of 60-100 Check vitamin D level  Elevated LFTs       -      CMP/GFR       -      Hepatitis panel  Continue diet and meds as discussed. Further disposition pending results of labs. Discussed med's effects and SE's.   Over 30 minutes of exam, counseling, chart review, and critical decision making was  performed.   Future Appointments  Date Time Provider Department Center  03/29/2019  8:45 AM Judd Gaudierorbett, Laylani Pudwill, NP [facility] None  12/20/2019 10:00 AM Judd Gaudierorbett, Aaliyha Mumford, NP [facility] None    ----------------------------------------------------------------------------------------------------------------------  HPI 33 y.o. male  presents for 3 month follow up on tremors, IBS -D/C, cholesterol, anxiety/insomnia and vitamin D deficiency.   He has bilateral hand tremor that is significantly improved with nadolol 40 mg daily which fully resolves tremors.   He has IBS with both diarrhea and constipation, he takes robinul 2 mg daily which significantly imroproves symptoms. *** who prescribed this? *** Has he seen GI? Who diagnosed ***  He has insomnia r/t shift work and anxiety and is prescribed trazodone 50 mg which he reports is beneficial most days. He is also prescribed wellbutrin for mood and to help with focus, most recently started on celexa 10 mg for anxious features. ***   he has a diagnosis of GERD which is currently managed by prilosec 40 mg 1-2 daily.  he reports symptoms is currently well controlled, and denies breakthrough reflux, burning in chest, hoarseness or cough.    BMI is Body mass index is 24.02 kg/m., he has been working on diet and exercise. Typically eats breakfast and dinner with light snacking for lunch. Drinks soda (mountain dew) occasionally on night shift if needed, usually only a few sips. Some sweet tea, mostly water.   BMI is There is no height or weight  on file to calculate BMI., he {HAS HAS IHK:74259} been working on diet and exercise. Wt Readings from Last 3 Encounters:  12/15/18 158 lb (71.7 kg)  01/23/18 169 lb (76.7 kg)  12/29/16 172 lb (78 kg)    His blood pressure {HAS HAS NOT:18834} been controlled at home, today their BP is    He {DOES_DOES DGL:87564} workout. He denies chest pain, shortness of breath, dizziness.   He is not on cholesterol medication,  working on lifestyle modification. His cholesterol is not at goal. The cholesterol last visit was:   Lab Results  Component Value Date   CHOL 227 (H) 12/15/2018   HDL 50 12/15/2018   LDLCALC 153 (H) 12/15/2018   TRIG 120 12/15/2018   CHOLHDL 4.5 12/15/2018   Patient is on Vitamin D supplement ***  Lab Results  Component Value Date   VD25OH 22 (L) 12/15/2018        Current Medications:  Current Outpatient Medications on File Prior to Visit  Medication Sig  . buPROPion (WELLBUTRIN XL) 150 MG 24 hr tablet TAKE 1 TABLET BY MOUTH IN THE MORNING  . citalopram (CELEXA) 10 MG tablet Take 1 tablet (10 mg total) by mouth daily.  Marland Kitchen glycopyrrolate (ROBINUL) 2 MG tablet TAKE 1 TABLET BY MOUTH TWICE DAILY  . nadolol (CORGARD) 40 MG tablet Start 40 mg daily for tremors, may increase slowly to max of 120 mg daily. Check blood pressure to make sure not getting too low.  Marland Kitchen omeprazole (PRILOSEC) 40 MG capsule Take 1 capsule (40 mg total) by mouth daily.  . traZODone (DESYREL) 50 MG tablet TAKE 1/2-1 TABLET BY MOUTH DAILY FOR SLEEP   No current facility-administered medications on file prior to visit.      Allergies: No Known Allergies   Medical History:  Past Medical History:  Diagnosis Date  . Heart murmur    w/ tachy as child  . IBS (irritable bowel syndrome)    Family history- Reviewed and unchanged Social history- Reviewed and unchanged   Review of Systems:  Review of Systems  Constitutional: Negative for malaise/fatigue and weight loss.  HENT: Negative for hearing loss and tinnitus.   Eyes: Negative for blurred vision and double vision.  Respiratory: Negative for cough, shortness of breath and wheezing.   Cardiovascular: Negative for chest pain, palpitations, orthopnea, claudication and leg swelling.  Gastrointestinal: Negative for abdominal pain, blood in stool, constipation, diarrhea, heartburn, melena, nausea and vomiting.  Genitourinary: Negative.   Musculoskeletal:  Negative for joint pain and myalgias.  Skin: Negative for rash.  Neurological: Positive for tremors (controlled by medication). Negative for dizziness, tingling, sensory change, weakness and headaches.  Endo/Heme/Allergies: Negative for polydipsia.  Psychiatric/Behavioral: Negative for depression and substance abuse. The patient is nervous/anxious and has insomnia.   All other systems reviewed and are negative.     Physical Exam: There were no vitals taken for this visit. Wt Readings from Last 3 Encounters:  12/15/18 158 lb (71.7 kg)  01/23/18 169 lb (76.7 kg)  12/29/16 172 lb (78 kg)   General Appearance: Well nourished, in no apparent distress. Eyes: PERRLA, EOMs, conjunctiva no swelling or erythema Sinuses: No Frontal/maxillary tenderness ENT/Mouth: Ext aud canals clear, TMs without erythema, bulging. No erythema, swelling, or exudate on post pharynx.  Tonsils not swollen or erythematous. Hearing normal.  Neck: Supple, thyroid normal.  Respiratory: Respiratory effort normal, BS equal bilaterally without rales, rhonchi, wheezing or stridor.  Cardio: RRR with no MRGs. Brisk peripheral pulses without edema.  Abdomen:  Soft, + BS.  Non tender, no guarding, rebound, hernias, masses. Lymphatics: Non tender without lymphadenopathy.  Musculoskeletal: Full ROM, 5/5 strength, {PSY - GAIT AND STATION:22860} gait Skin: Warm, dry without rashes, lesions, ecchymosis.  Neuro: Cranial nerves intact. No cerebellar symptoms.  Psych: Awake and oriented X 3, normal affect, Insight and Judgment appropriate.    Dan Maker, NP 8:11 AM [facility]

## 2019-03-29 ENCOUNTER — Ambulatory Visit: Payer: Self-pay | Admitting: Adult Health

## 2019-08-19 ENCOUNTER — Other Ambulatory Visit: Payer: Self-pay | Admitting: Internal Medicine

## 2019-08-19 DIAGNOSIS — R251 Tremor, unspecified: Secondary | ICD-10-CM

## 2019-08-19 MED ORDER — NADOLOL 40 MG PO TABS: ORAL_TABLET | ORAL | 1 refills | Status: DC

## 2019-08-29 ENCOUNTER — Other Ambulatory Visit: Payer: Self-pay | Admitting: Adult Health

## 2019-08-29 ENCOUNTER — Other Ambulatory Visit: Payer: Self-pay | Admitting: Internal Medicine

## 2019-12-04 ENCOUNTER — Other Ambulatory Visit: Payer: Self-pay | Admitting: Internal Medicine

## 2019-12-19 NOTE — Progress Notes (Deleted)
Complete Physical  Assessment and Plan:  Alexander White was seen today for annual exam.  Diagnoses and all orders for this visit:  Encounter for routine adult health examination without abnormal findings  Irritable bowel syndrome, unspecified type If not on benefiber then add it, decrease stress,  if any worsening symptoms, blood in stool, AB pain, etc call office Reports improved with robinul 2 mg daily; BID caused dry mouth  Gastroesophageal reflux disease with esophagitis Well managed on current medications Discussed diet, avoiding triggers and other lifestyle changes -     Magnesium -     omeprazole (PRILOSEC) 40 MG capsule; Take 1 capsule (40 mg total) by mouth daily.  Tremor of both hands Well controlled by BB; avoid excess sugar, caffeine, stress/sleep management discussed -     nadolol (CORGARD) 40 MG tablet; Start 40 mg daily for tremors, may increase slowly to max of 120 mg daily. Check blood pressure to make sure not getting too low.  Anxiety *** Wellbutrin helpful with focus though remains prone to anxiety; discussed trial of low dose SSRI to stabilize, he is agreeable to trial of this; discussed expected SE during the initial 2 weeks, full benefit needs 8-12 week trial duration. We will follow up in 12 weeks.  Stress management techniques discussed, increase water, good sleep hygiene discussed, increase exercise, and increase veggies.  -     citalopram (CELEXA) 10 MG tablet; Take 1 tablet (10 mg total) by mouth daily.  Insomnia, unspecified type Secondary to anxiety and shift work; trazodone is beneficial good sleep hygiene discussed, increase day time activity  BMI 24.0-24.9,adult  Medication management -     CBC with Differential/Platelet -     COMPLETE METABOLIC PANEL WITH GFR -     Urinalysis, Routine w reflex microscopic -     Magnesium  Screening cholesterol level -     Lipid panel  Screening for thyroid disorder -     TSH  Screening for diabetes mellitus -      Well controlled A1C last year; weight stable ***  Vitamin D deficiency -     VITAMIN D 25 Hydroxy (Vit-D Deficiency, Fractures)   Discussed med's effects and SE's. Screening labs and tests as requested with regular follow-up as recommended. Over 40 minutes of exam, counseling, chart review and critical decision making was performed  Future Appointments  Date Time Provider [place]  12/20/2019 10:00 AM Liane Comber, NP [facility] None  12/23/2020 10:00 AM Liane Comber, NP [facility] None     HPI 34 y.o. 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 He has GERD (gastroesophageal reflux disease); IBS (irritable bowel syndrome); Anxiety; Tremor of both hands; Insomnia; Hyperlipidemia; Elevated LFTs; and Vitamin D deficiency on their problem list.  He has bilateral hand tremor that is significantly improved with nadolol 40 mg daily which fully resolves tremors.   He has IBS with both diarrhea and constipation, he takes robinul 2 mg daily which significantly imroproves symptoms.   He has insomnia r/t shift work and anxiety and is prescribed trazodone 50 mg which he reports is beneficial most days.  He is also prescribed wellbutrin for mood and to help with focus. celexa 10 mg ***  he has a diagnosis of GERD which is currently managed by prilosec 40 mg 1-2 daily.  he reports symptoms is currently well controlled, and denies breakthrough reflux, burning in chest, hoarseness or cough.    BMI is There is no  height or weight on file to calculate BMI., he has been working on diet and exercise. Typically eats breakfast and dinner with light snacking for lunch. Drinks soda (mountain dew) occasionally on night shift if needed, usually only a few sips. Some sweet tea, mostly water.  Wt Readings from Last 3 Encounters:  12/15/18 158 lb (71.7 kg)  01/23/18 169 lb (76.7 kg)  12/29/16 172 lb (78 kg)   Today their BP is   He does workout.  He denies chest pain, shortness of breath, dizziness.    He {ACTION; IS/IS TIR:44315400} on cholesterol medication and denies myalgias. His cholesterol {ACTION; IS/IS NOT:21021397} at goal. The cholesterol last visit was:   Lab Results  Component Value Date   CHOL 227 (H) 12/15/2018   HDL 50 12/15/2018   LDLCALC 153 (H) 12/15/2018   TRIG 120 12/15/2018   CHOLHDL 4.5 12/15/2018    He {Has/has not:18111} been working on diet and exercise for glucose management, and denies {Symptoms; diabetes w/o none:19199}. Last A1C in the office was:  Lab Results  Component Value Date   HGBA1C 5.2 12/15/2018    Last GFR:  Lab Results  Component Value Date   GFRNONAA 91 12/15/2018   Patient is on Vitamin D supplement.   Lab Results  Component Value Date   VD25OH 22 (L) 12/15/2018     *** Lab Results  Component Value Date   ALT 102 (H) 12/15/2018   AST 38 12/15/2018   BILITOT 0.5 12/15/2018      No results found for: IRON, TIBC, FERRITIN  No results found for: VITAMINB12    Current Medications:  Current Outpatient Medications on File Prior to Visit  Medication Sig Dispense Refill  . buPROPion (WELLBUTRIN XL) 150 MG 24 hr tablet TAKE 1 TABLET BY MOUTH IN THE MORNING FOR MOOD FOCUS AND CONCENTRATION 90 tablet 0  . citalopram (CELEXA) 10 MG tablet Take 1 tablet (10 mg total) by mouth daily. 30 tablet 2  . glycopyrrolate (ROBINUL) 2 MG tablet Take 1 tablet 2 x  /day if needed for Abdominal Discomfort / Cramping 180 tablet 0  . nadolol (CORGARD) 40 MG tablet Take 1 tablet Daily for Tremors 90 tablet 1  . omeprazole (PRILOSEC) 40 MG capsule Take 1 capsule (40 mg total) by mouth daily. 90 capsule 1  . traZODone (DESYREL) 50 MG tablet TAKE 1/2-1 TABLET BY MOUTH DAILY FOR SLEEP 90 tablet 1   No current facility-administered medications on file prior to visit.   Allergies:  No Known Allergies Health Maintenance:  Immunization History  Administered Date(s) Administered  . HPV  Quadrivalent 11/01/2012  . Td 12/15/2018  . Tdap 11/29/2006    Tetanus: 11/2018 Flu vaccine: declines  HPV: 2013   Colonoscopy: -  EGD: -  Eye Exam: [facility], last 2016, wears glasses Dentist: Dr. Robby Sermon, last visit 2019, goes q22m  Patient Care Team: Lucky Cowboy, MD as PCP - General (Internal Medicine)  Medical History:  has GERD (gastroesophageal reflux disease); IBS (irritable bowel syndrome); Anxiety; Tremor of both hands; Insomnia; Hyperlipidemia; Elevated LFTs; and Vitamin D deficiency on their problem list. Surgical History:  He  has no past surgical history on file. Family History:  His family history includes CAD in his paternal grandfather; Cancer in his maternal grandmother; Dementia in his maternal grandmother; Depression in his brother and mother; Diabetes in his maternal grandmother and paternal grandfather; Heart attack (age of onset: 48) in his father; Hyperlipidemia in his mother; Hypertension in his maternal  grandmother; Irritable bowel syndrome in his mother; Lymphoma in his paternal grandmother; Stroke in his maternal grandfather. Social History:   reports that he has never smoked. He has never used smokeless tobacco. He reports that he does not drink alcohol or use drugs.   Review of Systems:    Review of Systems  Constitutional: Negative for malaise/fatigue and weight loss.  HENT: Negative for hearing loss and tinnitus.   Eyes: Negative for blurred vision and double vision.  Respiratory: Negative for cough, sputum production, shortness of breath and wheezing.   Cardiovascular: Negative for chest pain, palpitations, orthopnea, claudication, leg swelling and PND.  Gastrointestinal: Negative for abdominal pain, blood in stool, constipation, diarrhea, heartburn, melena, nausea and vomiting.  Genitourinary: Negative.   Musculoskeletal: Negative for falls, joint pain and myalgias.  Skin: Negative for rash.  Neurological: Positive for tremors (well  controlled on nadolol). Negative for dizziness, tingling, sensory change, weakness and headaches.  Endo/Heme/Allergies: Negative for polydipsia.  Psychiatric/Behavioral: Negative for depression, memory loss, substance abuse and suicidal ideas. 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   All other systems reviewed and are negative.   Physical Exam: Estimated body mass index is 24.02 kg/m as calculated from the following:   Height as of 12/15/18: 5\' 8"  (1.727 m).   Weight as of 12/15/18: 158 lb (71.7 kg). There were no vitals taken for this visit. General Appearance: Well nourished, in no apparent distress.  Eyes: PERRLA, EOMs, conjunctiva no swelling or erythema, normal fundi and vessels.  Sinuses: No Frontal/maxillary tenderness  ENT/Mouth: Ext aud canals clear, normal light reflex with TMs without erythema, bulging. Good dentition. No erythema, swelling, or exudate on post pharynx. Tonsils not swollen or erythematous. Hearing normal.  Neck: Supple, thyroid normal. No bruits  Respiratory: Respiratory effort normal, BS equal bilaterally without rales, rhonchi, wheezing or stridor.  Cardio: RRR without murmurs, rubs or gallops. Brisk peripheral pulses without edema.  Chest: symmetric, with normal excursions and percussion.  Abdomen: Soft, nontender, no guarding, rebound, hernias, masses, or organomegaly.  Lymphatics: Non tender without lymphadenopathy.  Genitourinary: Declines, no concerns Musculoskeletal: Full ROM all peripheral extremities,5/5 strength, and normal gait.  Skin: Warm, dry without rashes, lesions, ecchymosis. Neuro: Cranial nerves intact, reflexes equal bilaterally. Normal muscle tone, no cerebellar symptoms. Sensation intact. No tremor observed today.  Psych: Awake and oriented X 3, normal affect, Insight and Judgment appropriate.   EKG: Baseline in computer from 2016 - NSR - defer  2017 8:54 AM [facility]

## 2019-12-20 ENCOUNTER — Encounter: Payer: Self-pay | Admitting: Adult Health

## 2019-12-20 DIAGNOSIS — Z0000 Encounter for general adult medical examination without abnormal findings: Secondary | ICD-10-CM

## 2020-01-18 ENCOUNTER — Ambulatory Visit: Payer: Self-pay | Attending: Internal Medicine

## 2020-01-18 DIAGNOSIS — Z23 Encounter for immunization: Secondary | ICD-10-CM | POA: Insufficient documentation

## 2020-01-18 NOTE — Progress Notes (Signed)
   Covid-19 Vaccination Clinic  Name:  AVEION NGUYEN    MRN: [id number] DOB: 1985-12-28  01/18/2020  Mr. Brazzel was observed post Covid-19 immunization for 15 minutes without incidence. He was provided with Vaccine Information Sheet and instruction to access the V-Safe system.   Mr. Hambly was instructed to call 911 with any severe reactions post vaccine: Marland Kitchen Difficulty breathing  . Swelling of your face and throat  . A fast heartbeat  . A bad rash all over your body  . Dizziness and weakness    Immunizations Administered    Name Date Dose VIS Date Route   Pfizer COVID-19 Vaccine 01/18/2020 10:00 AM 0.3 mL 11/09/2019 Intramuscular   Manufacturer: [employer], [employer]   Lot: [id number]   NDC: [address]-4

## 2020-02-12 ENCOUNTER — Ambulatory Visit: Payer: Self-pay | Attending: Internal Medicine

## 2020-02-12 DIAGNOSIS — Z23 Encounter for immunization: Secondary | ICD-10-CM

## 2020-02-12 NOTE — Progress Notes (Signed)
   Covid-19 Vaccination Clinic  Name:  ZYKEEM BAUSERMAN    MRN: [id number] DOB: [birth year]  02/12/2020  Mr. Bhat was observed post Covid-19 immunization for 15 minutes without incident. He was provided with Vaccine Information Sheet and instruction to access the V-Safe system.   Mr. Tallo was instructed to call 911 with any severe reactions post vaccine: Marland Kitchen Difficulty breathing  . Swelling of face and throat  . A fast heartbeat  . A bad rash all over body  . Dizziness and weakness   Immunizations Administered    Name Date Dose VIS Date Route   Pfizer COVID-19 Vaccine 02/12/2020  8:23 AM 0.3 mL 11/09/2019 Intramuscular   Manufacturer: [employer], [employer]   Lot: [id number]   NDC: [address]-4

## 2020-03-01 ENCOUNTER — Other Ambulatory Visit: Payer: Self-pay | Admitting: Adult Health

## 2020-05-12 ENCOUNTER — Other Ambulatory Visit: Payer: Self-pay | Admitting: Internal Medicine

## 2020-06-12 ENCOUNTER — Other Ambulatory Visit: Payer: Self-pay | Admitting: Internal Medicine

## 2020-09-16 ENCOUNTER — Other Ambulatory Visit: Payer: Self-pay | Admitting: Internal Medicine

## 2020-09-18 ENCOUNTER — Other Ambulatory Visit: Payer: Self-pay | Admitting: Internal Medicine

## 2020-09-18 MED ORDER — BUPROPION HCL ER (XL) 150 MG PO TB24: ORAL_TABLET | ORAL | 1 refills | Status: DC

## 2020-11-14 ENCOUNTER — Other Ambulatory Visit: Payer: Self-pay | Admitting: Internal Medicine

## 2020-11-17 ENCOUNTER — Other Ambulatory Visit: Payer: Self-pay | Admitting: Internal Medicine

## 2020-11-17 DIAGNOSIS — K589 Irritable bowel syndrome without diarrhea: Secondary | ICD-10-CM

## 2020-11-17 MED ORDER — GLYCOPYRROLATE 2 MG PO TABS: ORAL_TABLET | ORAL | 0 refills | Status: DC

## 2020-12-10 ENCOUNTER — Other Ambulatory Visit: Payer: Self-pay

## 2020-12-10 ENCOUNTER — Encounter: Payer: Self-pay | Admitting: Adult Health Nurse Practitioner

## 2020-12-10 ENCOUNTER — Ambulatory Visit: Payer: 59 | Admitting: Adult Health Nurse Practitioner

## 2020-12-10 VITALS — BP 128/88 | HR 82 | Temp 98.6°F | Ht 68.0 in | Wt 170.0 lb

## 2020-12-10 DIAGNOSIS — R251 Tremor, unspecified: Secondary | ICD-10-CM | POA: Diagnosis not present

## 2020-12-10 DIAGNOSIS — E785 Hyperlipidemia, unspecified: Secondary | ICD-10-CM

## 2020-12-10 DIAGNOSIS — K2100 Gastro-esophageal reflux disease with esophagitis, without bleeding: Secondary | ICD-10-CM

## 2020-12-10 DIAGNOSIS — F419 Anxiety disorder, unspecified: Secondary | ICD-10-CM

## 2020-12-10 DIAGNOSIS — R748 Abnormal levels of other serum enzymes: Secondary | ICD-10-CM

## 2020-12-10 DIAGNOSIS — K589 Irritable bowel syndrome without diarrhea: Secondary | ICD-10-CM | POA: Diagnosis not present

## 2020-12-10 DIAGNOSIS — E559 Vitamin D deficiency, unspecified: Secondary | ICD-10-CM

## 2020-12-10 DIAGNOSIS — Z6824 Body mass index (BMI) 24.0-24.9, adult: Secondary | ICD-10-CM

## 2020-12-10 DIAGNOSIS — Z79899 Other long term (current) drug therapy: Secondary | ICD-10-CM

## 2020-12-10 MED ORDER — BUPROPION HCL ER (XL) 150 MG PO TB24: ORAL_TABLET | ORAL | 1 refills | Status: DC

## 2020-12-10 MED ORDER — NADOLOL 40 MG PO TABS: ORAL_TABLET | ORAL | 1 refills | Status: AC

## 2020-12-10 MED ORDER — HYOSCYAMINE SULFATE 0.125 MG PO TABS: 0.1250 mg | ORAL_TABLET | ORAL | 2 refills | Status: DC | PRN

## 2020-12-10 NOTE — Progress Notes (Addendum)
Addendum: Spoke with patient via telephone regarding lab results.  Will recheck LFT's in 2-4 weeks w/ acute hep panel.  Patient agrees with plan of care.  Elder Negus, AGNP-C, DNP [facility] 12/18/2020  12:51 PM      FOLLOW UP 3 MONTH  Assessment and Plan:  Hugo was seen today for annual exam.  Diagnoses and all orders for this visit:  Irritable bowel syndrome, unspecified type If not on benefiber then add it, decrease stress,  if any worsening symptoms, blood in stool, AB pain, etc call office Reports improved with robinul 2 mg daily; BID caused dry mouth not taking Rx hyoscyamine 0.4mg  QID PRN  Gastroesophageal reflux disease with esophagitis -     Magnesium -     omeprazole (PRILOSEC) 40 MG capsule; Take 1 capsule (40 mg total) by mouth daily.  Tremor of both hands Well controlled by BB; avoid excess sugar, caffeine, stress/sleep management discussed -     nadolol (CORGARD) 40 MG tablet; Start 40 mg daily for tremors, may increase slowly to max of 120 mg daily. Check blood pressure to make sure not getting too low.  Hyperlipidemia Diet controlled Discussed dietary and exercise modifications Low fat diet  Vitamin D Deficiency Continue supplementation to maintain goal of 70-100  Anxiety Doing well on current regiment Continue Wellbutrin XL 150mg  helpful with focus though remains prone to anxiety Discussed stress management techniques   Discussed good sleep hygiene Discussed increasing physical activity and exercise Increase water intake    Insomnia, unspecified type Secondary to anxiety and shift work; trazodone was beneficial in past No longer taking, doing well at this time good sleep hygiene discussed, increase day time activity, try melatonin or benadryl if this does not help we will call in sleep medication.   BMI 24.0-24.9,adult Discussed dietary and exercise modifications  Medication  management Contiued   Discussed med's effects and SE's. Screening labs and tests as requested with regular follow-up as recommended. Over 30 minutes of face to face interview, exam, counseling, chart review and critical decision making was performed   HPI 35 y.o. 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 He has GERD (gastroesophageal reflux disease); IBS (irritable bowel syndrome); Anxiety; Tremor of both hands; Insomnia; Hyperlipidemia; Elevated LFTs; and Vitamin D deficiency on their problem list.  Report that he is having some bloating on the left side.  It is intermittent in nature but constant when it appears.  Reports that I s more of an annoyance but no pain.  Denies anything making it worse.  Report after a bowel movement it is slightly better.  Consistently has mucus in his stools.  Report he does have IBS  He has bilateral hand tremor that is significantly improved with nadolol 40 mg daily which fully resolves tremors.   He has IBS with both diarrhea and constipation, he takes robinul 2 mg daily which significantly imroproves symptoms but has not been taking because of side effect dry mouth.   He has insomnia r/t shift work and anxiety and is prescribed trazodone 50 mg which he reports is beneficial most days. He is also prescribed wellbutrin for mood and to help with focus.   he has a diagnosis of GERD which is currently managed by prilosec 40 mg 1-2 daily.  he reports symptoms is currently well controlled, and denies breakthrough reflux, burning in chest, hoarseness or cough.    BMI is Body mass index  is 25.85 kg/m., he has been working on diet and exercise. Typically eats breakfast and dinner with light snacking for lunch. Drinks soda (mountain dew) occasionally on night shift if needed, usually only a few sips. Some sweet tea, mostly water.  Wt Readings from Last 3 Encounters:  12/10/20 170 lb (77.1 kg)  12/15/18 158 lb (71.7  kg)  01/23/18 169 lb (76.7 kg)   Today their BP is BP: 128/88 He does workout. He denies chest pain, shortness of breath, dizziness.    Current Medications:  Current Outpatient Medications on File Prior to Visit  Medication Sig Dispense Refill   buPROPion (WELLBUTRIN XL) 150 MG 24 hr tablet Take      1 tablet       every Morning       for Mood, Focus & Concentration 90 tablet 1   glycopyrrolate (ROBINUL) 2 MG tablet Take 1 tablet 2 x /day if needed for Nausea, Cramping , Bloating or Diarrhea 60 tablet 0   nadolol (CORGARD) 40 MG tablet Take 1 tablet Daily for Tremors 90 tablet 1   omeprazole (PRILOSEC) 40 MG capsule Take 1 capsule (40 mg total) by mouth daily. 90 capsule 1   No current facility-administered medications on file prior to visit.   Allergies:  No Known Allergies Health Maintenance:  Immunization History  Administered Date(s) Administered   HPV Quadrivalent 11/01/2012   PFIZER SARS-COV-2 Vaccination 01/18/2020, 02/12/2020   Td 12/15/2018   Tdap 11/29/2006    Tetanus: 11/2018  Flu vaccine: declines  HPV: 2013   Colonoscopy: - N/A EGD: - N/A  Eye Exam: [facility], last 2016, wears glasses Dentist: Dr. Robby Sermon, last visit 2019, goes q17m  Patient Care Team: Lucky Cowboy, MD as PCP - General (Internal Medicine)  Medical History:  has GERD (gastroesophageal reflux disease); IBS (irritable bowel syndrome); Anxiety; Tremor of both hands; Insomnia; Hyperlipidemia; Elevated LFTs; and Vitamin D deficiency on their problem list. Surgical History:  He  has no past surgical history on file. Family History:  His family history includes CAD in his paternal grandfather; Cancer in his maternal grandmother; Dementia in his maternal grandmother; Depression in his brother and mother; Diabetes in his maternal grandmother and paternal grandfather; Heart attack (age of onset: 71) in his father; Hyperlipidemia in his mother; Hypertension in his maternal  grandmother; Irritable bowel syndrome in his mother; Lymphoma in his paternal grandmother; Stroke in his maternal grandfather. Social History:   reports that he has never smoked. He has never used smokeless tobacco. He reports that he does not drink alcohol and does not use drugs.   Review of Systems:    Review of Systems  Constitutional: Negative for malaise/fatigue and weight loss.  HENT: Negative for hearing loss and tinnitus.   Eyes: Negative for blurred vision and double vision.  Respiratory: Negative for cough, sputum production, shortness of breath and wheezing.   Cardiovascular: Negative for chest pain, palpitations, orthopnea, claudication, leg swelling and PND.  Gastrointestinal: Negative for abdominal pain, blood in stool, constipation, diarrhea, heartburn, melena, nausea and vomiting.  Genitourinary: Negative.   Musculoskeletal: Negative for falls, joint pain and myalgias.  Skin: Negative for rash.  Neurological: Positive for tremors (well controlled on nadolol). Negative for dizziness, tingling, sensory change, weakness and headaches.  Endo/Heme/Allergies: Negative for polydipsia.  Psychiatric/Behavioral: Negative for depression, memory loss, substance abuse and suicidal ideas. 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   All other systems reviewed and are negative.   Physical Exam: Estimated  body mass index is 25.85 kg/m as calculated from the following:   Height as of this encounter: 5\' 8"  (1.727 m).   Weight as of this encounter: 170 lb (77.1 kg). BP 128/88    Pulse 82    Temp 98.6 F (37 C)    Ht 5\' 8"  (1.727 m)    Wt 170 lb (77.1 kg)    SpO2 98%    BMI 25.85 kg/m  General Appearance: Well nourished, in no apparent distress.  Eyes: PERRLA, EOMs, conjunctiva no swelling or erythema, normal fundi and vessels.  Sinuses: No Frontal/maxillary tenderness  ENT/Mouth: Ext aud canals clear, normal light reflex with TMs without erythema, bulging. Good  dentition. No erythema, swelling, or exudate on post pharynx. Tonsils not swollen or erythematous. Hearing normal.  Neck: Supple, thyroid normal. No bruits  Respiratory: Respiratory effort normal, BS equal bilaterally without rales, rhonchi, wheezing or stridor.  Cardio: RRR without murmurs, rubs or gallops. Brisk peripheral pulses without edema.  Chest: symmetric, with normal excursions and percussion.  Abdomen: Soft, nontender, no guarding, rebound, hernias, masses, or organomegaly.  Lymphatics: Non tender without lymphadenopathy.  Genitourinary: Defer, no issues Musculoskeletal: Full ROM all peripheral extremities,5/5 strength, and normal gait.  Skin: Warm, dry without rashes, lesions, ecchymosis. Neuro: Cranial nerves intact, reflexes equal bilaterally. Normal muscle tone, no cerebellar symptoms. Sensation intact. No tremor observed today.  Psych: Awake and oriented X 3, normal affect, Insight and Judgment appropriate.    , NP 11:12 AM [facility]

## 2020-12-11 LAB — COMPLETE METABOLIC PANEL WITH GFR
AG Ratio: 2.3 (calc) (ref 1.0–2.5)
ALT: 83 U/L — ABNORMAL HIGH (ref 9–46)
AST: 32 U/L (ref 10–40)
Albumin: 4.9 g/dL (ref 3.6–5.1)
Alkaline phosphatase (APISO): 78 U/L (ref 36–130)
BUN: 11 mg/dL (ref 7–25)
CO2: 33 mmol/L — ABNORMAL HIGH (ref 20–32)
Calcium: 10.3 mg/dL (ref 8.6–10.3)
Chloride: 101 mmol/L (ref 98–110)
Creat: 1.14 mg/dL (ref 0.60–1.35)
GFR, Est African American: 97 mL/min/{1.73_m2} (ref >=60)
GFR, Est Non African American: 84 mL/min/{1.73_m2} (ref >=60)
Globulin: 2.1 g/dL (calc) (ref 1.9–3.7)
Glucose, Bld: 83 mg/dL (ref 65–99)
Potassium: 4.3 mmol/L (ref 3.5–5.3)
Sodium: 140 mmol/L (ref 135–146)
Total Bilirubin: 0.7 mg/dL (ref 0.2–1.2)
Total Protein: 7 g/dL (ref 6.1–8.1)

## 2020-12-11 LAB — CBC WITH DIFFERENTIAL/PLATELET
Absolute Monocytes: 497 cells/uL (ref 200–950)
Basophils Absolute: 38 cells/uL (ref 0–200)
Basophils Relative: 0.7 %
Eosinophils Absolute: 81 cells/uL (ref 15–500)
Eosinophils Relative: 1.5 %
HCT: 47.2 % (ref 38.5–50.0)
Hemoglobin: 16.3 g/dL (ref 13.2–17.1)
Lymphs Abs: 1274 cells/uL (ref 850–3900)
MCH: 30.3 pg (ref 27.0–33.0)
MCHC: 34.5 g/dL (ref 32.0–36.0)
MCV: 87.7 fL (ref 80.0–100.0)
MPV: 12.3 fL (ref 7.5–12.5)
Monocytes Relative: 9.2 %
Neutro Abs: 3510 cells/uL (ref 1500–7800)
Neutrophils Relative %: 65 %
Platelets: 217 10*3/uL (ref 140–400)
RBC: 5.38 10*6/uL (ref 4.20–5.80)
RDW: 12.1 % (ref 11.0–15.0)
Total Lymphocyte: 23.6 %
WBC: 5.4 10*3/uL (ref 3.8–10.8)

## 2020-12-18 ENCOUNTER — Telehealth: Payer: Self-pay | Admitting: Adult Health Nurse Practitioner

## 2020-12-18 NOTE — Addendum Note (Signed)
Addended byElder Negus A on: 12/18/2020 12:51 PM   Modules accepted: Orders

## 2020-12-18 NOTE — Telephone Encounter (Signed)
-----   Message from Gregery Na, CMA sent at 12/17/2020  1:53 PM EST ----- Regarding: LAB RESULT Contact: [phone] Would like to talk to you abt lab results

## 2020-12-23 ENCOUNTER — Encounter: Payer: 59 | Admitting: Adult Health

## 2021-01-13 ENCOUNTER — Other Ambulatory Visit: Payer: 59

## 2021-01-22 ENCOUNTER — Other Ambulatory Visit: Payer: Self-pay

## 2021-01-22 ENCOUNTER — Other Ambulatory Visit: Payer: 59

## 2021-01-22 DIAGNOSIS — R748 Abnormal levels of other serum enzymes: Secondary | ICD-10-CM

## 2021-01-23 LAB — COMPLETE METABOLIC PANEL WITH GFR
AG Ratio: 2.4 (calc) (ref 1.0–2.5)
ALT: 74 U/L — ABNORMAL HIGH (ref 9–46)
AST: 26 U/L (ref 10–40)
Albumin: 4.5 g/dL (ref 3.6–5.1)
Alkaline phosphatase (APISO): 69 U/L (ref 36–130)
BUN: 18 mg/dL (ref 7–25)
CO2: 30 mmol/L (ref 20–32)
Calcium: 9.6 mg/dL (ref 8.6–10.3)
Chloride: 102 mmol/L (ref 98–110)
Creat: 1.31 mg/dL (ref 0.60–1.35)
GFR, Est African American: 82 mL/min/{1.73_m2} (ref >=60)
GFR, Est Non African American: 71 mL/min/{1.73_m2} (ref >=60)
Globulin: 1.9 g/dL (calc) (ref 1.9–3.7)
Glucose, Bld: 88 mg/dL (ref 65–99)
Potassium: 4.6 mmol/L (ref 3.5–5.3)
Sodium: 138 mmol/L (ref 135–146)
Total Bilirubin: 0.5 mg/dL (ref 0.2–1.2)
Total Protein: 6.4 g/dL (ref 6.1–8.1)

## 2021-01-23 LAB — HEPATITIS PANEL, ACUTE
Hep A IgM: NONREACTIVE
Hep B C IgM: NONREACTIVE
Hepatitis B Surface Ag: NONREACTIVE
Hepatitis C Ab: NONREACTIVE
SIGNAL TO CUT-OFF: 0.01 (ref <1.00)

## 2021-03-14 ENCOUNTER — Other Ambulatory Visit: Payer: Self-pay | Admitting: Internal Medicine

## 2021-03-14 DIAGNOSIS — K589 Irritable bowel syndrome without diarrhea: Secondary | ICD-10-CM

## 2021-09-19 ENCOUNTER — Other Ambulatory Visit: Payer: Self-pay | Admitting: Adult Health Nurse Practitioner

## 2021-09-19 DIAGNOSIS — F419 Anxiety disorder, unspecified: Secondary | ICD-10-CM

## 2022-10-03 ENCOUNTER — Other Ambulatory Visit: Payer: Self-pay | Admitting: Internal Medicine

## 2022-10-03 DIAGNOSIS — F419 Anxiety disorder, unspecified: Secondary | ICD-10-CM

## 2023-08-11 ENCOUNTER — Encounter: Payer: Self-pay | Admitting: Nurse Practitioner

## 2023-08-11 ENCOUNTER — Ambulatory Visit (INDEPENDENT_AMBULATORY_CARE_PROVIDER_SITE_OTHER): Payer: 59 | Admitting: Nurse Practitioner

## 2023-08-11 VITALS — BP 108/72 | HR 70 | Temp 97.7°F | Ht 68.0 in | Wt 167.2 lb

## 2023-08-11 DIAGNOSIS — Z6824 Body mass index (BMI) 24.0-24.9, adult: Secondary | ICD-10-CM

## 2023-08-11 DIAGNOSIS — B078 Other viral warts: Secondary | ICD-10-CM

## 2023-08-11 DIAGNOSIS — K2100 Gastro-esophageal reflux disease with esophagitis, without bleeding: Secondary | ICD-10-CM

## 2023-08-11 DIAGNOSIS — K589 Irritable bowel syndrome without diarrhea: Secondary | ICD-10-CM

## 2023-08-11 DIAGNOSIS — G4700 Insomnia, unspecified: Secondary | ICD-10-CM

## 2023-08-11 DIAGNOSIS — Z79899 Other long term (current) drug therapy: Secondary | ICD-10-CM

## 2023-08-11 DIAGNOSIS — Z13220 Encounter for screening for lipoid disorders: Secondary | ICD-10-CM

## 2023-08-11 DIAGNOSIS — Z0000 Encounter for general adult medical examination without abnormal findings: Secondary | ICD-10-CM | POA: Diagnosis not present

## 2023-08-11 DIAGNOSIS — Z0001 Encounter for general adult medical examination with abnormal findings: Secondary | ICD-10-CM

## 2023-08-11 DIAGNOSIS — Z131 Encounter for screening for diabetes mellitus: Secondary | ICD-10-CM

## 2023-08-11 DIAGNOSIS — E559 Vitamin D deficiency, unspecified: Secondary | ICD-10-CM

## 2023-08-11 DIAGNOSIS — R251 Tremor, unspecified: Secondary | ICD-10-CM

## 2023-08-11 DIAGNOSIS — F419 Anxiety disorder, unspecified: Secondary | ICD-10-CM

## 2023-08-11 DIAGNOSIS — Z1329 Encounter for screening for other suspected endocrine disorder: Secondary | ICD-10-CM

## 2023-08-11 NOTE — Progress Notes (Signed)
Complete Physical  Assessment and Plan:  [facility] was seen today for annual exam.  Diagnoses and all orders for this visit:  Encounter for routine adult health examination without abnormal findings Due annually  Health maintenance reviewed Healthily lifestyle goals set  Irritable bowel syndrome, unspecified type Currently well controlled. Start probiotic Stay well hydrated Reduce stress, avoid triggers. Robinul on hand.  Gastroesophageal reflux disease with esophagitis No suspected reflux complications (Barret/stricture). Lifestyle modification:  wt loss, avoid meals 2-3h before bedtime. Consider eliminating food triggers:  chocolate, caffeine, EtOH, acid/spicy food.  Tremor of both hands Well controlled  Avoid excess sugar, caffeine, stress/sleep management discussed  Anxiety Increase Wellbutrin 150 mg from every day to BID    Reviewed relaxation techniques.  Sleep hygiene. Recommended Cognitive Behavioral Therapy (CBT) PRN. Recommended mindfulness meditation and exercise.   Insight-oriented psychotherapy given for 16 minutes exclusively. Psychoeducation:  encouraged personality growth wand development through coping techniques and problem-solving skills. Limit/Decrease/Monitor drug/alcohol intake.    Insomnia, unspecified type Secondary to anxiety and shift work; trazodone is beneficial Discussed good sleep hygiene. Establish bed and wake times. Sleep restriction-only sleep estimated hrs sleep. Bed only for sex and sleep, only sleep when sleepy, out of bed if anxious (stimulus control). Reviewed relaxation techniques, mindful meditations. Expected sleep duration. Addressed worries about not sleeping.   BMI 24.0 Discussed appropriate BMI Diet modification. Physical activity. Encouraged/praised to build confidence.  Medication management All medications discussed and reviewed in full. All questions and concerns regarding medications addressed.    Screening  cholesterol level -     Lipid panel  Screening for thyroid disorder -     TSH  Screening for diabetes mellitus -     Hemoglobin A1c  Vitamin D deficiency -     VITAMIN D 25 Hydroxy (Vit-D Deficiency, Fractures)  Other viral wart Schedule apt with Dr. Oneta Rack for removal   Orders Placed This Encounter  Procedures   CBC with Differential/Platelet   COMPLETE METABOLIC PANEL WITH GFR   Lipid panel   TSH   Hemoglobin A1c   Insulin, random   VITAMIN D 25 Hydroxy (Vit-D Deficiency, Fractures)    Notify office for further evaluation and treatment, questions or concerns if any reported s/s fail to improve.   The patient was advised to call back or seek an in-person evaluation if any symptoms worsen or if the condition fails to improve as anticipated.   Further disposition pending results of labs. Discussed med's effects and SE's.    I discussed the assessment and treatment plan with the patient. The patient was provided an opportunity to ask questions and all were answered. The patient agreed with the plan and demonstrated an understanding of the instructions.  Discussed med's effects and SE's. Screening labs and tests as requested with regular follow-up as recommended.  I provided 35 minutes of face-to-face time during this encounter including counseling, chart review, and critical decision making was preformed.  Today's Plan of Care is based on a patient-centered health care approach known as shared decision making - the decisions, tests and treatments allow for patient preferences and values to be balanced with clinical evidence.       Future Appointments  Date Time Provider Department Center  10/17/2023  4:00 PM Lucky Cowboy, MD [facility] None  08/10/2024 10:30 AM Adela Glimpse, NP [facility] None     HPI 37 y.o. Male, presents for a complete physical. He has GERD (gastroesophageal reflux disease); IBS (irritable bowel syndrome); Anxiety; Tremor of both hands;  Insomnia; Hyperlipidemia; Elevated LFTs; and Vitamin D deficiency on their problem list.  He has a fiance, expecting.  Two young children (84 yo and 1 yo).  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  Not much sleep.  He is concerned for an area to the back of his head/crown that is raised and scaly.  Continues to grow.  Denies itching, pain, drainage. No recent injury, fall, trauma.   He has bilateral hand tremor that was treated with nadolol 40 mg however, improved and no longer taking.   He has IBS with both diarrhea and constipation, he takes robinul 2 mg daily which has helped with symptoms in the past.  Most often diarrhea, however recently better  managed.    He has insomnia r/t shift work and anxiety Was prescribed trazodone 50 mg which was beneficial but no longer needs.  Is on Wellbutrin that helps. Has been taking since 2017. Also helps with focus.  Has had more anxiety over the past few months.   he has a diagnosis of GERD which is currently managed by prilosec 40 mg 1-2 daily.  he reports symptoms is currently well controlled, and denies breakthrough reflux, burning in chest, hoarseness or cough.    BMI is Body mass index is 25.42 kg/m., he has been working on diet and exercise. Typically eats breakfast and dinner with light snacking for lunch. Drinks soda (mountain dew) occasionally on night shift if needed, usually only a few sips. Some sweet tea, mostly water.  Wt Readings from Last 3 Encounters:  08/11/23 167 lb 3.2 oz (75.8 kg)  12/10/20 170 lb (77.1 kg)  12/15/18 158 lb (71.7 kg)   Today their BP is BP: 108/72 He does workout. He denies chest pain, shortness of breath, dizziness.    Current Medications:  Current Outpatient Medications on File Prior to Visit  Medication Sig Dispense Refill   buPROPion (WELLBUTRIN XL) 150 MG 24 hr tablet Take  1 tablet  Daily for Mood, Focus & Concentration                              /                                            TAKE                                    BY                                      MOUTH                                          ONCE DAILY 90 tablet 3   Multiple Vitamin (MULTIVITAMIN ADULT PO) Take by mouth daily.     omeprazole (PRILOSEC) 40 MG capsule Take 1 capsule (40 mg total) by mouth daily. 90 capsule 1   glycopyrrolate (ROBINUL) 1 MG tablet Take  1 tablet  4 x /day  before Meals & Bedtime  if needed for Nausea,  Cramping, Diarrhea or Bloating 360 tablet 0   hyoscyamine (LEVSIN) 0.125 MG tablet Take 1 tablet (0.125 mg total) by mouth every 4 (four) hours as needed for cramping (diarrhea, nausea). 50 tablet 2   nadolol (CORGARD) 40 MG tablet Take 1 tablet Daily for Tremors (Patient not taking: Reported on 08/11/2023) 90 tablet 1   No current facility-administered medications on file prior to visit.   Allergies:  No Known Allergies Health Maintenance:  Immunization History  Administered Date(s) Administered   HPV Quadrivalent 11/01/2012   PFIZER(Purple Top)SARS-COV-2 Vaccination 01/18/2020, 02/12/2020   Td 12/15/2018   Tdap 11/29/2006    Tetanus: 2020 Flu vaccine: declines  HPV: 2013   Colonoscopy: - Not due  EGD: -  Eye Exam: [facility], last 2023, wears glasses Dentist: Dr. Robby Sermon, last visit 2023, goes q78m  Patient Care Team: Lucky Cowboy, MD as PCP - General (Internal Medicine)  Medical History:  has GERD (gastroesophageal reflux disease); IBS (irritable bowel syndrome); Anxiety; Tremor of both hands; Insomnia; Hyperlipidemia; Elevated LFTs; and Vitamin D deficiency on their problem list. Surgical History:  He  has no past surgical history on file. Family History:  His family history includes CAD in his paternal grandfather; Cancer in his maternal grandmother; Dementia in his maternal grandmother; Depression in his brother and mother; Diabetes in his maternal grandmother and paternal grandfather; Heart attack (age of onset: 74) in  his father; Hyperlipidemia in his mother; Hypertension in his maternal grandmother; Irritable bowel syndrome in his mother; Lymphoma in his paternal grandmother; Stroke in his maternal grandfather. Social History:   reports that he has never smoked. He has never used smokeless tobacco. He reports that he does not drink alcohol and does not use drugs.   Review of Systems:    Review of Systems  Constitutional:  Negative for malaise/fatigue and weight loss.  HENT:  Negative for hearing loss and tinnitus.   Eyes:  Negative for blurred vision and double vision.  Respiratory:  Negative for cough, sputum production, shortness of breath and wheezing.   Cardiovascular:  Negative for chest pain, palpitations, orthopnea, claudication, leg swelling and PND.  Gastrointestinal:  Negative for abdominal pain, blood in stool, constipation, diarrhea, heartburn, melena, nausea and vomiting.  Genitourinary: Negative.   Musculoskeletal:  Negative for falls, joint pain and myalgias.  Skin:  Negative for rash.  Neurological:  Positive for tremors. Negative for dizziness, tingling, sensory change, weakness and headaches.  Endo/Heme/Allergies:  Negative for polydipsia.  Psychiatric/Behavioral:  Negative for depression, memory loss, substance abuse and suicidal ideas. The patient is nervous/anxious and has insomnia.   All other systems reviewed and are negative.   Physical Exam: Estimated body mass index is 25.42 kg/m as calculated from the following:   Height as of this encounter: 5\' 8"  (1.727 m).   Weight as of this encounter: 167 lb 3.2 oz (75.8 kg). BP 108/72   Pulse 70   Temp 97.7 F (36.5 C)   Ht 5\' 8"  (1.727 m)   Wt 167 lb 3.2 oz (75.8 kg)   SpO2 99%   BMI 25.42 kg/m  General Appearance: Well nourished, in no apparent distress.  Eyes: PERRLA, EOMs, conjunctiva no swelling or erythema, normal fundi and vessels.  Sinuses: No Frontal/maxillary tenderness  ENT/Mouth: Ext aud canals clear, normal light  reflex with TMs without erythema, bulging. Good dentition. No erythema, swelling, or exudate on post pharynx. Tonsils not swollen or erythematous. Hearing normal.  Neck: Supple, thyroid normal. No bruits  Respiratory: Respiratory effort  normal, BS equal bilaterally without rales, rhonchi, wheezing or stridor.  Cardio: RRR without murmurs, rubs or gallops. Brisk peripheral pulses without edema.  Chest: symmetric, with normal excursions and percussion.  Abdomen: Soft, nontender, no guarding, rebound, hernias, masses, or organomegaly.  Lymphatics: Non tender without lymphadenopathy.  Genitourinary: Defer, no issues Musculoskeletal: Full ROM all peripheral extremities,5/5 strength, and normal gait.  Skin: Crown of scalp with 3 cm raised slightly irregularly shaped cluster of flat growths, fibrous in texture.  Surrounding skin warm, dry without rashes, lesions, ecchymosis. Neuro: Cranial nerves intact, reflexes equal bilaterally. Normal muscle tone, no cerebellar symptoms. Sensation intact. No tremor observed today.  Psych: Awake and oriented X 3, normal affect, Insight and Judgment appropriate.   EKG: Baseline in computer from 2016 - NSR - defer  Alexander White 12:25 PM [facility]

## 2023-08-11 NOTE — Patient Instructions (Signed)
Warts  Warts are small growths on the skin. They are common, and they are caused by a virus. Warts can be found on many parts of the body. A person may have one wart or many warts. Most warts will go away on their own with time, but this could take many months to a few years. If needed, warts can be treated. What are the causes? A type of virus called HPV causes warts. HPV can spread from person to person through touching. Warts can also spread to other parts of the body when a person scratches a wart and then scratches another part of his or her body. What increases the risk? You are more likely to get warts if: You are 10-20 years old. You have a weak body defense system (immune system). You are Caucasian. What are the signs or symptoms? The main symptom of this condition is small growths on the skin. Warts may: Have different shapes. They may be round, oval, or uneven. Feel rough to the touch. Be the color of your skin or light yellow, brown, or gray. Often be less than  inch (1.3 cm) in size. Go away and then come back again. Most warts do not hurt, but some can hurt if they are large or if they are on the bottom of your feet. How is this diagnosed? A wart can often be diagnosed by how it looks. In some cases, the doctor might remove a little bit of the wart to test it (biopsy). How is this treated? Most of the time, warts do not need treatment. Sometimes people want warts removed. If treatment is needed or wanted, it may include: Putting creams or patches with medicine in them on the wart. Putting duct tape over the top of the wart. Freezing the wart. Burning the wart with: A laser. An electric probe. Giving a shot of medicine into the wart to help the body's defense system fight off the wart. Surgery to remove the wart. Follow these instructions at home:  Medicines Use over-the-counter and prescription medicines only as told by your doctor. Do not use over-the-counter wart  medicines on your face or genitals before asking your doctor. Lifestyle Keep your body's defense system healthy. To do this: Eat a healthy diet. Get enough sleep. Do not smoke or use any products that contain nicotine or tobacco. If you need help quitting, ask your doctor. General instructions Wash your hands after you touch a wart. Do not scratch or pick at a wart. Avoid shaving hair that is over a wart. Keep all follow-up visits. Contact a doctor if: Your warts do not get better after treatment. You have redness, swelling, or pain at the site of a wart. Your wart is bleeding, and the bleeding does not stop when you lightly press on the wart. You have diabetes and you get a wart. Summary Warts are small growths on the skin. They are common and are caused by a virus. Most of the time, warts do not need treatment. Sometimes people want warts removed. If treatment is needed or wanted, there are many options. Apply over-the-counter and prescription medicines only as told by your doctor. Wash your hands after you touch a wart. Keep all follow-up visits. This information is not intended to replace advice given to you by your health care provider. Make sure you discuss any questions you have with your health care provider. Document Revised: 12/18/2021 Document Reviewed: 12/18/2021 Elsevier Patient Education  2024 [employer]  

## 2023-08-12 LAB — CBC WITH DIFFERENTIAL/PLATELET
Absolute Monocytes: 490 {cells}/uL (ref 200–950)
Basophils Absolute: 51 {cells}/uL (ref 0–200)
Basophils Relative: 1 %
Eosinophils Absolute: 92 {cells}/uL (ref 15–500)
Eosinophils Relative: 1.8 %
HCT: 46.6 % (ref 38.5–50.0)
Hemoglobin: 15.4 g/dL (ref 13.2–17.1)
Lymphs Abs: 1714 {cells}/uL (ref 850–3900)
MCH: 29.2 pg (ref 27.0–33.0)
MCHC: 33 g/dL (ref 32.0–36.0)
MCV: 88.4 fL (ref 80.0–100.0)
MPV: 12.2 fL (ref 7.5–12.5)
Monocytes Relative: 9.6 %
Neutro Abs: 2754 {cells}/uL (ref 1500–7800)
Neutrophils Relative %: 54 %
Platelets: 185 10*3/uL (ref 140–400)
RBC: 5.27 10*6/uL (ref 4.20–5.80)
RDW: 12.5 % (ref 11.0–15.0)
Total Lymphocyte: 33.6 %
WBC: 5.1 10*3/uL (ref 3.8–10.8)

## 2023-08-12 LAB — COMPLETE METABOLIC PANEL WITH GFR
AG Ratio: 2 (calc) (ref 1.0–2.5)
ALT: 51 U/L — ABNORMAL HIGH (ref 9–46)
AST: 28 U/L (ref 10–40)
Albumin: 4.7 g/dL (ref 3.6–5.1)
Alkaline phosphatase (APISO): 85 U/L (ref 36–130)
BUN: 10 mg/dL (ref 7–25)
CO2: 31 mmol/L (ref 20–32)
Calcium: 9.4 mg/dL (ref 8.6–10.3)
Chloride: 101 mmol/L (ref 98–110)
Creat: 1.07 mg/dL (ref 0.60–1.26)
Globulin: 2.4 g/dL (ref 1.9–3.7)
Glucose, Bld: 81 mg/dL (ref 65–99)
Potassium: 4.1 mmol/L (ref 3.5–5.3)
Sodium: 139 mmol/L (ref 135–146)
Total Bilirubin: 0.6 mg/dL (ref 0.2–1.2)
Total Protein: 7.1 g/dL (ref 6.1–8.1)
eGFR: 92 mL/min/{1.73_m2} (ref >=60)

## 2023-08-12 LAB — LIPID PANEL
Cholesterol: 208 mg/dL — ABNORMAL HIGH (ref <200)
HDL: 55 mg/dL (ref >=40)
LDL Cholesterol (Calc): 131 mg/dL — ABNORMAL HIGH
Non-HDL Cholesterol (Calc): 153 mg/dL — ABNORMAL HIGH (ref <130)
Total CHOL/HDL Ratio: 3.8 (calc) (ref <5.0)
Triglycerides: 117 mg/dL (ref <150)

## 2023-08-12 LAB — HEMOGLOBIN A1C
Hgb A1c MFr Bld: 5.3 %{Hb} (ref <5.7)
Mean Plasma Glucose: 105 mg/dL
eAG (mmol/L): 5.8 mmol/L

## 2023-08-12 LAB — INSULIN, RANDOM: Insulin: 8.8 u[IU]/mL

## 2023-08-12 LAB — VITAMIN D 25 HYDROXY (VIT D DEFICIENCY, FRACTURES): Vit D, 25-Hydroxy: 28 ng/mL — ABNORMAL LOW (ref 30–100)

## 2023-08-12 LAB — TSH: TSH: 1.53 m[IU]/L (ref 0.40–4.50)

## 2023-09-02 ENCOUNTER — Other Ambulatory Visit: Payer: Self-pay | Admitting: Nurse Practitioner

## 2023-09-02 DIAGNOSIS — F419 Anxiety disorder, unspecified: Secondary | ICD-10-CM

## 2023-09-02 MED ORDER — BUPROPION HCL ER (XL) 150 MG PO TB24: 150.0000 mg | ORAL_TABLET | Freq: Two times a day (BID) | ORAL | 3 refills | Status: DC

## 2023-09-06 ENCOUNTER — Other Ambulatory Visit: Payer: Self-pay | Admitting: Nurse Practitioner

## 2023-09-06 MED ORDER — BUPROPION HCL ER (SR) 150 MG PO TB12: 150.0000 mg | ORAL_TABLET | Freq: Two times a day (BID) | ORAL | 2 refills | Status: AC

## 2023-10-02 NOTE — Progress Notes (Unsigned)
     Future Appointments  Date Time Provider Department  10/03/2023  4:00 PM Alexander Cowboy, MD [facility]  08/10/2024 10:30 AM Adela Glimpse, NP [facility]    History of Present Illness:     This very nice 37 yo single WM with GERD, IBS, Vit D Deficiency  presents for evaluation of a raised scaly lesion of the posterior apex scalp.       Current Outpatient Medications on File Prior to Visit  Medication Sig   buPROPion (WELLBUTRIN SR) 150 MG 12 hr tablet Take 1 tablet (150 mg total) by mouth 2 (two) times daily.   Multiple Vitamin (MULTIVITAMIN ADULT PO) Take by mouth daily.   nadolol (CORGARD) 40 MG tablet Take 1 tablet Daily for Tremors (Patient not taking: Reported on 08/11/2023)   omeprazole (PRILOSEC) 40 MG capsule Take 1 capsule (40 mg total) by mouth daily.     No Known Allergies   Problem list He has GERD (gastroesophageal reflux disease); IBS (irritable bowel syndrome); Anxiety; Tremor of both hands; Insomnia; Hyperlipidemia; Elevated LFTs; and Vitamin D deficiency on their problem list.   Observations/Objective:  There were no vitals taken for this visit.  HEENT - WNL. Neck - supple.  Chest - Clear equal BS. Cor - Nl HS. RRR w/o sig MGR. PP 1(+). No edema. MS- FROM w/o deformities.  Gait Nl. Neuro -  Nl w/o focal abnormalities.   Assessment and Plan:      Follow Up Instructions:        I discussed the assessment and treatment plan with the patient. The patient was provided an opportunity to ask questions and all were answered. The patient agreed with the plan and demonstrated an understanding of the instructions.       The patient was advised to call back or seek an in-person evaluation if the symptoms worsen or if the condition fails to improve as anticipated.    Alexander Maw, MD

## 2023-10-03 ENCOUNTER — Ambulatory Visit: Payer: 59 | Admitting: Internal Medicine

## 2023-10-04 ENCOUNTER — Other Ambulatory Visit: Payer: Self-pay | Admitting: Internal Medicine

## 2023-10-04 DIAGNOSIS — F419 Anxiety disorder, unspecified: Secondary | ICD-10-CM

## 2023-10-17 ENCOUNTER — Encounter: Payer: 59 | Admitting: Internal Medicine

## 2023-10-26 ENCOUNTER — Encounter: Payer: Self-pay | Admitting: Nurse Practitioner

## 2023-10-26 ENCOUNTER — Ambulatory Visit: Payer: 59 | Admitting: Nurse Practitioner

## 2023-10-26 VITALS — BP 102/70 | HR 87 | Temp 97.5°F | Ht 68.0 in | Wt 168.0 lb

## 2023-10-26 DIAGNOSIS — K2100 Gastro-esophageal reflux disease with esophagitis, without bleeding: Secondary | ICD-10-CM

## 2023-10-26 DIAGNOSIS — R1032 Left lower quadrant pain: Secondary | ICD-10-CM

## 2023-10-26 DIAGNOSIS — K5792 Diverticulitis of intestine, part unspecified, without perforation or abscess without bleeding: Secondary | ICD-10-CM | POA: Diagnosis not present

## 2023-10-26 DIAGNOSIS — R109 Unspecified abdominal pain: Secondary | ICD-10-CM

## 2023-10-26 DIAGNOSIS — K589 Irritable bowel syndrome without diarrhea: Secondary | ICD-10-CM | POA: Diagnosis not present

## 2023-10-26 MED ORDER — SUCRALFATE 1 G PO TABS: 1.0000 g | ORAL_TABLET | Freq: Three times a day (TID) | ORAL | 0 refills | Status: AC

## 2023-10-26 MED ORDER — METRONIDAZOLE 500 MG PO TABS: 500.0000 mg | ORAL_TABLET | Freq: Two times a day (BID) | ORAL | 0 refills | Status: AC

## 2023-10-26 MED ORDER — CIPROFLOXACIN HCL 500 MG PO TABS: 500.0000 mg | ORAL_TABLET | Freq: Two times a day (BID) | ORAL | 0 refills | Status: AC

## 2023-10-26 MED ORDER — HYOSCYAMINE SULFATE 0.125 MG PO TBDP
0.1250 mg | ORAL_TABLET | ORAL | 0 refills | Status: AC | PRN
Start: 2023-10-26 — End: ?

## 2023-10-26 MED ORDER — ESOMEPRAZOLE MAGNESIUM 40 MG PO CPDR: 40.0000 mg | DELAYED_RELEASE_CAPSULE | Freq: Two times a day (BID) | ORAL | 0 refills | Status: DC

## 2023-10-26 NOTE — Patient Instructions (Addendum)
Gastritis Use metronidazole 500 mg two times a day for 7 days Use Cipro 500 mg twice a day for 7 days  Diverticulitis Nexium 40 mg twice a day for 14 days Carafate dissolve in small amount of water and drink before each meal and at bedtime Follow up in 1 week  If develop fever, severe abdominal pain go to ER  Diverticulitis is when small pouches in your colon get infected or swollen. This causes pain in your belly (abdomen) and watery poop (diarrhea). The small pouches are called diverticula. They may form if you have a condition called diverticulosis. What are the causes? You may get this condition if poop (stool) gets trapped in the pouches in your colon. The poop lets germs (bacteria) grow. This causes an infection. What increases the risk? You are more likely to get this condition if you have small pouches in your colon. You are also more likely to get it if: You are overweight or very overweight (obese). You do not exercise enough. You drink alcohol. You smoke. You eat a lot of red meat, like beef, pork, or lamb. You do not eat enough fiber. You are older than 36 years of age. What are the signs or symptoms? Pain in your belly. Pain is often on the left side, but it may be felt in other spots too. Fever and chills. Feeling like you may vomit. Vomiting. Having cramps. Feeling full. Changes in how often you poop. Blood in your poop. How is this treated? Most cases are treated at home. You may be told to: Take over-the-counter pain medicines. Only eat and drink clear liquids. Take antibiotics. Rest. Very bad cases may need to be treated at a hospital. Treatment may include: Not eating or drinking. Taking pain medicines. Getting antibiotics through an IV tube. Getting fluid and food through an IV tube. Having surgery. When you are feeling better, you may need to have a test to look at your colon (colonoscopy). Follow these instructions at home: Medicines Take  over-the-counter and prescription medicines only as told by your doctor. These include: Fiber pills. Probiotics. Medicines to make your poop soft (stool softeners). If you were prescribed antibiotics, take them as told by your doctor. Do not stop taking them even if you start to feel better. Ask your doctor if you should avoid driving or using machines while you are taking your medicine. Eating and drinking  Follow the diet told by your doctor. You may need to only eat and drink liquids. When you feel better, you may be able to eat more foods. You may also be told to eat a lot of fiber. Fiber helps you poop. Foods with fiber include berries, beans, lentils, and green vegetables. Try not to eat red meat. General instructions Do not smoke or use any products that contain nicotine or tobacco. If you need help quitting, ask your doctor. Exercise 3 or more times a week. Try to go for 30 minutes each time. Exercise enough to sweat and make your heart beat faster. Contact a doctor if: Your pain gets worse. You are not pooping like normal. Your symptoms do not get better. Your symptoms get worse very fast. You have a fever. You vomit more than one time. You have poop that is: Bloody. Black. Tarry. This information is not intended to replace advice given to you by your health care provider. Make sure you discuss any questions you have with your health care provider. Document Revised: 08/12/2022 Document Reviewed: 08/12/2022 Elsevier Patient  Education  2024 [employer].

## 2023-10-26 NOTE — Progress Notes (Signed)
Assessment and Plan:  Alexander White was seen today for acute visit.  Diagnoses and all orders for this visit:  Irritable bowel syndrome, unspecified type FODMAP diet -     hyoscyamine (ANASPAZ) 0.125 MG TBDP disintergrating tablet; Place 1 tablet (0.125 mg total) under the tongue every 4 (four) hours as needed.  Gastroesophageal reflux disease with esophagitis, unspecified whether hemorrhage Follow Nexium and carafate regimen Eat bland diet Follow up in 1 week for reevaluation -     esomeprazole (NEXIUM) 40 MG capsule; Take 1 capsule (40 mg total) by mouth 2 (two) times daily before a meal for 14 days. -     sucralfate (CARAFATE) 1 g tablet; Take 1 tablet (1 g total) by mouth 4 (four) times daily -  with meals and at bedtime for 14 days.  Left lower quadrant abdominal pain -     CBC with Differential/Platelet -     COMPLETE METABOLIC PANEL WITH GFR -     hyoscyamine (ANASPAZ) 0.125 MG TBDP disintergrating tablet; Place 1 tablet (0.125 mg total) under the tongue every 4 (four) hours as needed.  Diverticulitis Bland diet, avoid red meat, seeds and nuts Use Flagyl and Cipro as directed If develop a fever or abdominal pain worsens go to the ER -     metroNIDAZOLE (FLAGYL) 500 MG tablet; Take 1 tablet (500 mg total) by mouth 2 (two) times daily. -     ciprofloxacin (CIPRO) 500 MG tablet; Take 1 tablet (500 mg total) by mouth 2 (two) times daily for 10 days.  Left flank pain Monitor for hematuria -     Urinalysis, Routine w reflex microscopic -     Urine Culture       Further disposition pending results of labs. Discussed med's effects and SE's.   Over 30 minutes of exam, counseling, chart review, and critical decision making was performed.   Future Appointments  Date Time Provider Department Center  08/10/2024 10:30 AM [place], Archie Patten, NP [facility] None    ------------------------------------------------------------------------------------------------------------------   HPI BP  102/70   Pulse 87   Temp (!) 97.5 F (36.4 C)   Ht 5\' 8"  (1.727 m)   Wt 168 lb (76.2 kg)   SpO2 99%   BMI 25.54 kg/m  37 y.o.male presents for stomach pain x 1 week. Fever/chills, nausea and vomiting, body aches. GERD is very severe currently and omeprazole bid has not controlled.   Pt has history of IBS and has used hyoscyamine in the past.  Also has GERD and uses omeprazole 40 mg every day   BP well controlled without medication BP Readings from Last 3 Encounters:  10/26/23 102/70  08/11/23 108/72  12/10/20 128/88  Denies headaches, chest pain, shortness of breath and dizziness  BMI is Body mass index is 25.54 kg/m., he has been working on diet and exercise. Wt Readings from Last 3 Encounters:  10/26/23 168 lb (76.2 kg)  08/11/23 167 lb 3.2 oz (75.8 kg)  12/10/20 170 lb (77.1 kg)     Past Medical History:  Diagnosis Date   Heart murmur    w/ tachy as child   IBS (irritable bowel syndrome)      No Known Allergies  Current Outpatient Medications on File Prior to Visit  Medication Sig   buPROPion (WELLBUTRIN SR) 150 MG 12 hr tablet Take 1 tablet (150 mg total) by mouth 2 (two) times daily.   Multiple Vitamin (MULTIVITAMIN ADULT PO) Take by mouth daily.   omeprazole (PRILOSEC) 40 MG capsule Take  1 capsule (40 mg total) by mouth daily.   nadolol (CORGARD) 40 MG tablet Take 1 tablet Daily for Tremors (Patient not taking: Reported on 08/11/2023)   No current facility-administered medications on file prior to visit.    Review of Systems  Constitutional:  Positive for malaise/fatigue. Negative for chills, fever and weight loss.  HENT:  Negative for congestion and hearing loss.   Eyes:  Negative for blurred vision and double vision.  Respiratory:  Negative for cough and shortness of breath.   Cardiovascular:  Negative for chest pain, palpitations, orthopnea and leg swelling.  Gastrointestinal:  Positive for abdominal pain, diarrhea, heartburn, nausea and vomiting.  Negative for constipation.  Musculoskeletal:  Negative for falls, joint pain and myalgias.  Skin:  Negative for rash.  Neurological:  Negative for dizziness, tingling, tremors, loss of consciousness and headaches.  Psychiatric/Behavioral:  Negative for depression, memory loss and suicidal ideas.      Physical Exam:  BP 102/70   Pulse 87   Temp (!) 97.5 F (36.4 C)   Ht 5\' 8"  (1.727 m)   Wt 168 lb (76.2 kg)   SpO2 99%   BMI 25.54 kg/m   General Appearance: Pleasant male who is ill appearing Eyes: PERRLA, EOMs, conjunctiva no swelling or erythema Sinuses: No Frontal/maxillary tenderness ENT/Mouth: Ext aud canals clear, TMs without erythema, bulging. No erythema, swelling, or exudate on post pharynx. Hearing normal.  Neck: Supple, thyroid normal.  Respiratory: Respiratory effort normal, BS equal bilaterally without rales, rhonchi, wheezing or stridor.  Cardio: RRR with no MRGs. Brisk peripheral pulses without edema.  Abdomen: Soft, + BS.  Tenderness LLQ. Belching throughout visit Lymphatics: Non tender without lymphadenopathy.  Musculoskeletal: Full ROM, 5/5 strength, normal gait. No CVA tenderness  Skin: Warm, dry without rashes, lesions, ecchymosis.  Neuro: Cranial nerves intact. Normal muscle tone, no cerebellar symptoms. Sensation intact.  Psych: Awake and oriented X 3, normal affect, Insight and Judgment appropriate.     Raynelle Dick, NP 3:10 PM [facility]

## 2023-10-27 LAB — URINALYSIS, ROUTINE W REFLEX MICROSCOPIC
Bacteria, UA: NONE SEEN /[HPF]
Bilirubin Urine: NEGATIVE
Glucose, UA: NEGATIVE
Hgb urine dipstick: NEGATIVE
Hyaline Cast: NONE SEEN /[LPF]
Leukocytes,Ua: NEGATIVE
Nitrite: NEGATIVE
Specific Gravity, Urine: 1.03 (ref 1.001–1.035)
Squamous Epithelial / HPF: NONE SEEN /[HPF] (ref <=5)
WBC, UA: NONE SEEN /[HPF] (ref 0–5)
pH: 6.5 (ref 5.0–8.0)

## 2023-10-27 LAB — URINE CULTURE
MICRO NUMBER:: 15788981
Result:: NO GROWTH
SPECIMEN QUALITY:: ADEQUATE

## 2023-10-27 LAB — CBC WITH DIFFERENTIAL/PLATELET
Absolute Lymphocytes: 1163 {cells}/uL (ref 850–3900)
Absolute Monocytes: 851 {cells}/uL (ref 200–950)
Basophils Absolute: 53 {cells}/uL (ref 0–200)
Basophils Relative: 0.7 %
Eosinophils Absolute: 84 {cells}/uL (ref 15–500)
Eosinophils Relative: 1.1 %
HCT: 44.9 % (ref 38.5–50.0)
Hemoglobin: 15.2 g/dL (ref 13.2–17.1)
MCH: 29.7 pg (ref 27.0–33.0)
MCHC: 33.9 g/dL (ref 32.0–36.0)
MCV: 87.7 fL (ref 80.0–100.0)
MPV: 11.6 fL (ref 7.5–12.5)
Monocytes Relative: 11.2 %
Neutro Abs: 5449 {cells}/uL (ref 1500–7800)
Neutrophils Relative %: 71.7 %
Platelets: 235 10*3/uL (ref 140–400)
RBC: 5.12 10*6/uL (ref 4.20–5.80)
RDW: 12.1 % (ref 11.0–15.0)
Total Lymphocyte: 15.3 %
WBC: 7.6 10*3/uL (ref 3.8–10.8)

## 2023-10-27 LAB — COMPLETE METABOLIC PANEL WITH GFR
AG Ratio: 1.9 (calc) (ref 1.0–2.5)
ALT: 71 U/L — ABNORMAL HIGH (ref 9–46)
AST: 36 U/L (ref 10–40)
Albumin: 4.4 g/dL (ref 3.6–5.1)
Alkaline phosphatase (APISO): 109 U/L (ref 36–130)
BUN: 11 mg/dL (ref 7–25)
CO2: 32 mmol/L (ref 20–32)
Calcium: 9.1 mg/dL (ref 8.6–10.3)
Chloride: 99 mmol/L (ref 98–110)
Creat: 0.99 mg/dL (ref 0.60–1.26)
Globulin: 2.3 g/dL (ref 1.9–3.7)
Glucose, Bld: 78 mg/dL (ref 65–99)
Potassium: 3.6 mmol/L (ref 3.5–5.3)
Sodium: 137 mmol/L (ref 135–146)
Total Bilirubin: 1 mg/dL (ref 0.2–1.2)
Total Protein: 6.7 g/dL (ref 6.1–8.1)
eGFR: 101 mL/min/{1.73_m2} (ref >=60)

## 2023-10-27 LAB — MICROSCOPIC MESSAGE

## 2023-11-01 NOTE — Progress Notes (Unsigned)
Assessment and Plan:  Alexander White was seen today for acute visit.  Diagnoses and all orders for this visit:  Irritable bowel syndrome, unspecified type FODMAP diet Continue hyoscyamine as needed  Gastroesophageal reflux disease with esophagitis, unspecified whether hemorrhage Follow Nexium and carafate regimen for remainder of 2 weeks and then start Nexium 40 mg every day , can d/c carafate after 2 weeks Eat bland diet  Anxiety Decrease Wellbutrin back to 150 mg every day Start Lexapro 10 mg 1 tab PO every day  Follow up in 4 weeks for reevaluation  Diverticulitis Bland diet, avoid red meat, seeds and nuts Much improved complete antibiotics and use hyoscyamine as needed If develop a fever or abdominal pain worsens go to the ER          Further disposition pending results of labs. Discussed med's effects and SE's.   Over 30 minutes of exam, counseling, chart review, and critical decision making was performed.   Future Appointments  Date Time Provider Department Center  08/10/2024 10:30 AM [place], Archie Patten, NP [facility] None    ------------------------------------------------------------------------------------------------------------------   HPI BP 102/70   Pulse 88   Temp (!) 97.5 F (36.4 C)   Ht 5\' 8"  (1.727 m)   Wt 176 lb 9.6 oz (80.1 kg)   SpO2 97%   BMI 26.85 kg/m  37 y.o.male presents for reevaluation of abdominal pain. Has been using Cipro and Flagyl for diverticulitis and Nexium 40 mg BID and Carafate for esophagitis. Pain has resolved and will have only an occasional cramp. No longer having food feeling stuck sensation. He has used Levsin at night when needed for cramping.   BP well controlled without medication BP Readings from Last 3 Encounters:  11/02/23 102/70  10/26/23 102/70  08/11/23 108/72  Denies headaches, chest pain, shortness of breath and dizziness  BMI is Body mass index is 26.85 kg/m., he has been working on diet and exercise. Wt  Readings from Last 3 Encounters:  11/02/23 176 lb 9.6 oz (80.1 kg)  10/26/23 168 lb (76.2 kg)  08/11/23 167 lb 3.2 oz (75.8 kg)   He has been having a lot more anxiety and has a shorter fuse with his children  He is on Wellbutrin 150 mg BID and that works well for depression but nothing for anxiety symptoms.   Past Medical History:  Diagnosis Date   Heart murmur    w/ tachy as child   IBS (irritable bowel syndrome)      No Known Allergies  Current Outpatient Medications on File Prior to Visit  Medication Sig   buPROPion (WELLBUTRIN SR) 150 MG 12 hr tablet Take 1 tablet (150 mg total) by mouth 2 (two) times daily.   ciprofloxacin (CIPRO) 500 MG tablet Take 1 tablet (500 mg total) by mouth 2 (two) times daily for 10 days.   esomeprazole (NEXIUM) 40 MG capsule Take 1 capsule (40 mg total) by mouth 2 (two) times daily before a meal for 14 days.   hyoscyamine (ANASPAZ) 0.125 MG TBDP disintergrating tablet Place 1 tablet (0.125 mg total) under the tongue every 4 (four) hours as needed.   metroNIDAZOLE (FLAGYL) 500 MG tablet Take 1 tablet (500 mg total) by mouth 2 (two) times daily.   Multiple Vitamin (MULTIVITAMIN ADULT PO) Take by mouth daily.   sucralfate (CARAFATE) 1 g tablet Take 1 tablet (1 g total) by mouth 4 (four) times daily -  with meals and at bedtime for 14 days.   nadolol (CORGARD) 40 MG tablet Take  1 tablet Daily for Tremors (Patient not taking: Reported on 08/11/2023)   No current facility-administered medications on file prior to visit.    Review of Systems  Constitutional:  Positive for malaise/fatigue. Negative for chills, fever and weight loss.  HENT:  Negative for congestion and hearing loss.   Eyes:  Negative for blurred vision and double vision.  Respiratory:  Negative for cough and shortness of breath.   Cardiovascular:  Negative for chest pain, palpitations, orthopnea and leg swelling.  Gastrointestinal:  Positive for abdominal pain, diarrhea, heartburn, nausea  and vomiting. Negative for constipation.  Musculoskeletal:  Negative for falls, joint pain and myalgias.  Skin:  Negative for rash.  Neurological:  Negative for dizziness, tingling, tremors, loss of consciousness and headaches.  Psychiatric/Behavioral:  Negative for depression, memory loss and suicidal ideas.      Physical Exam:  BP 102/70   Pulse 88   Temp (!) 97.5 F (36.4 C)   Ht 5\' 8"  (1.727 m)   Wt 176 lb 9.6 oz (80.1 kg)   SpO2 97%   BMI 26.85 kg/m   General Appearance: Pleasant male who is ill appearing Eyes: PERRLA, EOMs, conjunctiva no swelling or erythema Sinuses: No Frontal/maxillary tenderness ENT/Mouth: Ext aud canals clear, TMs without erythema, bulging. No erythema, swelling, or exudate on post pharynx. Hearing normal.  Neck: Supple, thyroid normal.  Respiratory: Respiratory effort normal, BS equal bilaterally without rales, rhonchi, wheezing or stridor.  Cardio: RRR with no MRGs. Brisk peripheral pulses without edema.  Abdomen: Soft, + BS.  Tenderness LLQ. Belching throughout visit Lymphatics: Non tender without lymphadenopathy.  Musculoskeletal: Full ROM, 5/5 strength, normal gait. No CVA tenderness  Skin: Warm, dry without rashes, lesions, ecchymosis.  Neuro: Cranial nerves intact. Normal muscle tone, no cerebellar symptoms. Sensation intact.  Psych: Awake and oriented X 3, normal affect, Insight and Judgment appropriate.     Raynelle Dick, NP 3:04 PM [facility]

## 2023-11-02 ENCOUNTER — Ambulatory Visit (INDEPENDENT_AMBULATORY_CARE_PROVIDER_SITE_OTHER): Payer: 59 | Admitting: Nurse Practitioner

## 2023-11-02 ENCOUNTER — Encounter: Payer: Self-pay | Admitting: Nurse Practitioner

## 2023-11-02 VITALS — BP 102/70 | HR 88 | Temp 97.5°F | Ht 68.0 in | Wt 176.6 lb

## 2023-11-02 DIAGNOSIS — K5792 Diverticulitis of intestine, part unspecified, without perforation or abscess without bleeding: Secondary | ICD-10-CM | POA: Diagnosis not present

## 2023-11-02 DIAGNOSIS — K2100 Gastro-esophageal reflux disease with esophagitis, without bleeding: Secondary | ICD-10-CM | POA: Diagnosis not present

## 2023-11-02 DIAGNOSIS — F419 Anxiety disorder, unspecified: Secondary | ICD-10-CM

## 2023-11-02 DIAGNOSIS — K589 Irritable bowel syndrome without diarrhea: Secondary | ICD-10-CM | POA: Diagnosis not present

## 2023-11-02 MED ORDER — ESCITALOPRAM OXALATE 10 MG PO TABS: 10.0000 mg | ORAL_TABLET | Freq: Every day | ORAL | 2 refills | Status: AC

## 2023-11-02 MED ORDER — ESOMEPRAZOLE MAGNESIUM 40 MG PO CPDR: 40.0000 mg | DELAYED_RELEASE_CAPSULE | Freq: Every day | ORAL | 6 refills | Status: AC

## 2023-11-02 NOTE — Patient Instructions (Signed)
Gastroesophageal Reflux Disease, Adult  Gastroesophageal reflux (GER) happens when acid from the stomach flows up into the tube that connects the mouth and the stomach (esophagus). Normally, food travels down the esophagus and stays in the stomach to be digested. With GER, food and stomach acid sometimes move back up into the esophagus. You may have a disease called gastroesophageal reflux disease (GERD) if the reflux: Happens often. Causes frequent or very bad symptoms. Causes problems such as damage to the esophagus. When this happens, the esophagus becomes sore and swollen. Over time, GERD can make small holes (ulcers) in the lining of the esophagus. What are the causes? This condition is caused by a problem with the muscle between the esophagus and the stomach. When this muscle is weak or not normal, it does not close properly to keep food and acid from coming back up from the stomach. The muscle can be weak because of: Tobacco use. Pregnancy. Having a certain type of hernia (hiatal hernia). Alcohol use. Certain foods and drinks, such as coffee, chocolate, onions, and peppermint. What increases the risk? Being overweight. Having a disease that affects your connective tissue. Taking NSAIDs, such a ibuprofen. What are the signs or symptoms? Heartburn. Difficult or painful swallowing. The feeling of having a lump in the throat. A bitter taste in the mouth. Bad breath. Having a lot of saliva. Having an upset or bloated stomach. Burping. Chest pain. Different conditions can cause chest pain. Make sure you see your doctor if you have chest pain. Shortness of breath or wheezing. A long-term cough or a cough at night. Wearing away of the surface of teeth (tooth enamel). Weight loss. How is this treated? Making changes to your diet. Taking medicine. Having surgery. Treatment will depend on how bad your symptoms are. Follow these instructions at home: Eating and drinking  Follow a  diet as told by your doctor. You may need to avoid foods and drinks such as: Coffee and tea, with or without caffeine. Drinks that contain alcohol. Energy drinks and sports drinks. Bubbly (carbonated) drinks or sodas. Chocolate and cocoa. Peppermint and mint flavorings. Garlic and onions. Horseradish. Spicy and acidic foods. These include peppers, chili powder, curry powder, vinegar, hot sauces, and BBQ sauce. Citrus fruit juices and citrus fruits, such as oranges, lemons, and limes. Tomato-based foods. These include red sauce, chili, salsa, and pizza with red sauce. Fried and fatty foods. These include donuts, french fries, potato chips, and high-fat dressings. High-fat meats. These include hot dogs, rib eye steak, sausage, ham, and bacon. High-fat dairy items, such as whole milk, butter, and cream cheese. Eat small meals often. Avoid eating large meals. Avoid drinking large amounts of liquid with your meals. Avoid eating meals during the 2-3 hours before bedtime. Avoid lying down right after you eat. Do not exercise right after you eat. Lifestyle  Do not smoke or use any products that contain nicotine or tobacco. If you need help quitting, ask your doctor. Try to lower your stress. If you need help doing this, ask your doctor. If you are overweight, lose an amount of weight that is healthy for you. Ask your doctor about a safe weight loss goal. General instructions Pay attention to any changes in your symptoms. Take over-the-counter and prescription medicines only as told by your doctor. Do not take aspirin, ibuprofen, or other NSAIDs unless your doctor says it is okay. Wear loose clothes. Do not wear anything tight around your waist. Raise (elevate) the head of your bed about   6 inches (15 cm). You may need to use a wedge to do this. Avoid bending over if this makes your symptoms worse. Keep all follow-up visits. Contact a doctor if: You have new symptoms. You lose weight and you  do not know why. You have trouble swallowing or it hurts to swallow. You have wheezing or a cough that keeps happening. You have a hoarse voice. Your symptoms do not get better with treatment. Get help right away if: You have sudden pain in your arms, neck, jaw, teeth, or back. You suddenly feel sweaty, dizzy, or light-headed. You have chest pain or shortness of breath. You vomit and the vomit is green, yellow, or black, or it looks like blood or coffee grounds. You faint. Your poop (stool) is red, bloody, or black. You cannot swallow, drink, or eat. These symptoms may represent a serious problem that is an emergency. Do not wait to see if the symptoms will go away. Get medical help right away. Call your local emergency services (911 in the [place] Do not drive yourself to the hospital. Summary If a person has gastroesophageal reflux disease (GERD), food and stomach acid move back up into the esophagus and cause symptoms or problems such as damage to the esophagus. Treatment will depend on how bad your symptoms are. Follow a diet as told by your doctor. Take all medicines only as told by your doctor. This information is not intended to replace advice given to you by your health care provider. Make sure you discuss any questions you have with your health care provider. Document Revised: 05/26/2020 Document Reviewed: 05/26/2020 Elsevier Patient Education  2024 [employer]  

## 2023-12-01 ENCOUNTER — Encounter: Payer: Self-pay | Admitting: Nurse Practitioner

## 2023-12-01 ENCOUNTER — Ambulatory Visit (INDEPENDENT_AMBULATORY_CARE_PROVIDER_SITE_OTHER): Payer: 59 | Admitting: Nurse Practitioner

## 2023-12-01 ENCOUNTER — Ambulatory Visit: Payer: 59 | Admitting: Nurse Practitioner

## 2023-12-01 VITALS — BP 130/100 | HR 97 | Temp 98.2°F | Ht 68.0 in | Wt 171.6 lb

## 2023-12-01 DIAGNOSIS — L0591 Pilonidal cyst without abscess: Secondary | ICD-10-CM | POA: Diagnosis not present

## 2023-12-01 DIAGNOSIS — M533 Sacrococcygeal disorders, not elsewhere classified: Secondary | ICD-10-CM

## 2023-12-01 DIAGNOSIS — R600 Localized edema: Secondary | ICD-10-CM

## 2023-12-01 DIAGNOSIS — L539 Erythematous condition, unspecified: Secondary | ICD-10-CM

## 2023-12-01 DIAGNOSIS — R030 Elevated blood-pressure reading, without diagnosis of hypertension: Secondary | ICD-10-CM

## 2023-12-01 MED ORDER — IBUPROFEN 800 MG PO TABS: 800.0000 mg | ORAL_TABLET | Freq: Three times a day (TID) | ORAL | 0 refills | Status: AC | PRN

## 2023-12-01 MED ORDER — DOXYCYCLINE HYCLATE 100 MG PO TABS: 100.0000 mg | ORAL_TABLET | Freq: Two times a day (BID) | ORAL | 0 refills | Status: AC

## 2023-12-01 MED ORDER — BACITRACIN 500 UNIT/GM EX OINT: 1.0000 | TOPICAL_OINTMENT | Freq: Two times a day (BID) | CUTANEOUS | 0 refills | Status: AC

## 2023-12-01 MED ORDER — DEXAMETHASONE SODIUM PHOSPHATE 10 MG/ML IJ SOLN
10.0000 mg | Freq: Once | INTRAMUSCULAR | Status: AC
  Administered 2023-12-01: 10 mg via INTRAMUSCULAR

## 2023-12-01 NOTE — Patient Instructions (Signed)
 Keflex antibiotic administered oral in clinic. Dexamethasone  steroid injection administered for pain and swelling  Start treatment with Doxycycline  antibiotic. Apply Bacitracin  ointment to outer area of skin and keep covered especially if draining. Continue treatment with Ibuprofen  and Tylenol - can take 1,000 mg Tylenol up to four times a day - do not exceed 4,000 mg in 24/hours.  May benefit from a [employer] Refer to General Surgery pending - they will contact you to arrange an appointment. Repor to ER for any increase in fever, chills, nausea, vomiting or uncontrolled pain.   Pilonidal Cyst Drainage  A pilonidal cyst is a fluid-filled sac that forms under the skin near the tailbone, at the top of the crease of the buttocks (pilonidal area). Incision and drainage is a procedure to open and drain a pilonidal cyst. You may need this procedure if your cyst becomes infected (pilonidal abscess). A pilonidal abscess may cause pain and swelling. There are three types of procedures to drain a pilonidal cyst. The type of procedure you have will depend on the size, location, and severity of your cyst. You may have: Incision and drainage with wound packing. Wound packing involves placing a germ-free packing material (gauze) into the incision. Marsupialization. This involves opening and draining the cyst. The edges of the incision are then stitched (sutured) to the skin around the incision. Incision and drainage without wound packing. The incision is closed and covered with a dressing. Tell a health care provider about: Any allergies you have. All medicines you are taking, including vitamins, herbs, eye drops, creams, and over-the-counter medicines. Any problems you or family members have had with anesthetic medicines. Any bleeding problems you have. Any surgeries you have had. Any medical conditions you have. Whether you are pregnant or may be pregnant. What are the risks? Talk to your health care  provider about risks. These may include: Infection. Bleeding. Allergic reactions to medicines. The cyst coming back again (recurrence). What happens before the procedure? When to stop eating and drinking Follow instructions from your health care provider about what you may eat and drink. These may include: 8 hours before your procedure Stop eating most foods. Do not eat meat, fried foods, or fatty foods. Eat only light foods, such as toast or crackers. All liquids are okay except energy drinks and alcohol. 6 hours before your procedure Stop eating. Drink only clear liquids, such as water, clear fruit juice, black coffee, plain tea, and sports drinks. Do not drink energy drinks or alcohol. 2 hours before your procedure Stop drinking all liquids. You may be allowed to take medicines with small sips of water. If you do not follow your health care provider's instructions, your procedure may be delayed or canceled. Medicines Ask your health care provider about: Changing or stopping your regular medicines. These include any diabetes medicines or blood thinners you take. Taking medicines such as aspirin and ibuprofen . These medicines can thin your blood. Do not take them unless your health care provider tells you to. Taking over-the-counter medicines, vitamins, herbs, and supplements. Surgery safety Ask your health care provider: How your surgery site will be marked. What steps will be taken to help prevent infection. These steps may include: Removing hair at the surgery site. Washing skin with a soap that kills germs. Taking antibiotics. General instructions Do not use any products that contain nicotine or tobacco for at least 4 weeks before the procedure. These products include cigarettes, chewing tobacco, and vaping devices, such as e-cigarettes. If you need help  quitting, ask your health care provider. If you will be going home right after the procedure, plan to have a responsible  adult: Take you home from the hospital or clinic. You will not be allowed to drive. Care for you for the time you are told. You may need help with wound care and dressing changes. What happens during the procedure? An IV may be inserted into one of your veins. You may be given: A sedative. This helps you relax. Anesthesia. This will: Numb certain areas of your body. Make you fall asleep for surgery. You will lie down on your stomach. Tape may be used to spread your buttocks. The area around the cyst will be cleaned. An incision will be made in the cyst. Your surgeon may insert a tube with a light and camera on the end (probe). This is done to see how deep the cyst is. Fluid or pus inside the cyst will be drained. The cyst will be flushed out (irrigated). The rest of the procedure will depend on the type of procedure you are having. For incision and drainage with wound packing: Gauze will be placed into the wound. This keeps the wound open so it can keep draining after surgery. It also helps the wound heal from the inside out. The area will be covered with a dressing. For marsupialization: The edges of the incision will be stitched to your skin. This keeps the wound open while it heals. It also helps deep wounds heal from the inside out. A dressing will be placed over the wound. For incision and drainage without wound packing: Some tissue around the cyst may be removed. The incision may be closed with sutures, skin glue, or adhesive strips. The incision will be covered with a dressing. These procedures may vary among health care providers and hospitals. What happens after the procedure? Your blood pressure, heart rate, breathing rate, and blood oxygen level will be monitored until you leave the hospital or clinic. You will be given medicine for pain as needed. If you were given a sedative during the procedure, it can affect you for several hours. Do not drive or operate machinery until  your health care provider says that it is safe. Summary A pilonidal cyst is a fluid-filled sac that forms under the skin near the tailbone, at the top of the crease of the buttocks (pilonidal area). Incision and drainage is a procedure to open and drain the cyst. The type of procedure you have will depend on the size, location, and severity of your cyst. You may need this procedure if your cyst becomes infected (pilonidal abscess). This information is not intended to replace advice given to you by your health care provider. Make sure you discuss any questions you have with your health care provider. Document Revised: 02/10/2022 Document Reviewed: 02/10/2022 Elsevier Patient Education  2024 [employer].

## 2023-12-01 NOTE — Progress Notes (Signed)
 Assessment and Plan:  Alexander White was seen today for an episodic visit.  Diagnoses and all order for this visit:  Pilonidal cyst (Primary) Keflex 500 mg x2 administered in office. Start treatment with Doxycycline  abx Dexamethasone  injection administered to minimize pain and swelling - patient tolerated well.   Continue to alternate Tylenol and IBU for pain management. Sitz bath may help Apply bacitracin  ointment to outside of cysts and keep covered if draining.  Refer to General Surgery for further review and evaluation.    - doxycycline  (VIBRA -TABS) 100 MG tablet; Take 1 tablet (100 mg total) by mouth 2 (two) times daily for 7 days.  Dispense: 14 tablet; Refill: 0 - ibuprofen  (ADVIL ) 800 MG tablet; Take 1 tablet (800 mg total) by mouth every 8 (eight) hours as needed.  Dispense: 30 tablet; Refill: 0 - bacitracin  500 UNIT/GM ointment; Apply 1 Application topically 2 (two) times daily.  Dispense: 15 g; Refill: 0 - dexamethasone  (DECADRON ) injection 10 mg - Ambulatory referral to General Surgery  Sacral pain Decadron  injection administered for pain and swelling - patient tolerated well Start IBU 800 mg as directed PRN Continue Tylenol 1,000 mg QID PRN Continue to monitor for increase in uncontrolled pain - if not relief contact office or report to ER as area  may need to be opened and drained.   - dexamethasone  (DECADRON ) injection 10 mg  Localized erythema/Edema Discussed benefit of Sitz bath Continue warm compress to area to allow for drainage.   Elevated BP Secondary to pain otherwise well controlled. Continue to monitor  Notify office for further evaluation and treatment, questions or concerns if s/s fail to improve. The risks and benefits of my recommendations, as well as other treatment options were discussed with the patient today. Questions were answered.  Further disposition pending results of labs. Discussed med's effects and SE's.    Over 30 minutes of exam,  counseling, chart review, and critical decision making was performed.   Future Appointments  Date Time Provider Department Center  08/10/2024 10:30 AM [place], [place], NP [facility] None    ------------------------------------------------------------------------------------------------------------------   HPI BP (!) 130/100   Pulse 97   Temp 98.2 F (36.8 C)   Ht 5' 8 (1.727 m)   Wt 171 lb 9.6 oz (77.8 kg)   SpO2 98%   BMI 26.09 kg/m   38 y.o.male presents for evaluation of sacral pain accompanied by redness and swelling.  Symptoms started x3 days ago.  Patient and wife thought the area to be a boil, patient was able to manipulate the area and notice slight serosanguinous drainage, however, has not been able to reproduce any drainage after.  The pain is located at the sacral region.  He has been sleeping on a heating pad and placing a salve substance to the area along with triple antibiotic ointment without relief. Has taken 1,000 mg Tylenol for pain with minimal relief.  He is unable to sit without pain.  Pain is 10/10. 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  Has not had this occur in the past or no hx of boil or pilonidal cyst.  Denies fever, chills, nausea, vomiting.   Past Medical History:  Diagnosis Date   Heart murmur    w/ tachy as child   IBS (irritable bowel syndrome)      No Known Allergies  Current Outpatient Medications on File Prior to Visit  Medication Sig   buPROPion  (WELLBUTRIN  SR) 150 MG 12 hr tablet  Take 1 tablet (150 mg total) by mouth 2 (two) times daily.   escitalopram  (LEXAPRO ) 10 MG tablet Take 1 tablet (10 mg total) by mouth daily.   esomeprazole  (NEXIUM ) 40 MG capsule Take 1 capsule (40 mg total) by mouth daily.   hyoscyamine  (ANASPAZ ) 0.125 MG TBDP disintergrating tablet Place 1 tablet (0.125 mg total) under the tongue every 4 (four) hours as needed.   metroNIDAZOLE  (FLAGYL ) 500 MG tablet Take 1 tablet (500 mg total) by  mouth 2 (two) times daily.   Multiple Vitamin (MULTIVITAMIN ADULT PO) Take by mouth daily.   nadolol  (CORGARD ) 40 MG tablet Take 1 tablet Daily for Tremors   sucralfate  (CARAFATE ) 1 g tablet Take 1 tablet (1 g total) by mouth 4 (four) times daily -  with meals and at bedtime for 14 days.   No current facility-administered medications on file prior to visit.    ROS: all negative except what is noted in the HPI.   Physical Exam:  BP (!) 130/100   Pulse 97   Temp 98.2 F (36.8 C)   Ht 5' 8 (1.727 m)   Wt 171 lb 9.6 oz (77.8 kg)   SpO2 98%   BMI 26.09 kg/m   General Appearance: NAD.  Awake, conversant and cooperative. Eyes: PERRLA, EOMs intact.  Sclera white.  Conjunctiva without erythema. Sinuses: No frontal/maxillary tenderness.  No nasal discharge. Nares patent.  ENT/Mouth: Ext aud canals clear.  Bilateral TMs w/DOL and without erythema or bulging. Hearing intact.  Posterior pharynx without swelling or exudate.  Tonsils without swelling or erythema.  Neck: Supple.  No masses, nodules or thyromegaly. Respiratory: Effort is regular with non-labored breathing. Breath sounds are equal bilaterally without rales, rhonchi, wheezing or stridor.  Cardio: RRR with no MRGs. Brisk peripheral pulses without edema.  Abdomen: Active BS in all four quadrants.  Soft and non-tender without guarding, rebound tenderness, hernias or masses. Lymphatics: Non tender without lymphadenopathy.  Musculoskeletal: Full ROM, 5/5 strength, normal ambulation.  No clubbing or cyanosis. Skin: Sacral area with approximately 3 cm round raised mildly edematous and erythematous area of skin.  Painful to touch.  No drainage. Surrounding area appropriate color for ethnicity. Warm without rashes, lesions, ecchymosis, ulcers.  Neuro: CN II-XII grossly normal. Normal muscle tone without cerebellar symptoms and intact sensation.   Psych: AO X 3,  appropriate mood and affect, insight and judgment.     [place] NECESSARY,  NP 10:15 AM [facility]

## 2024-02-02 ENCOUNTER — Encounter: Payer: Self-pay | Admitting: *Deleted

## 2024-08-10 ENCOUNTER — Encounter: Payer: 59 | Admitting: Nurse Practitioner
